# Patient Record
Sex: Male | Born: 1975 | Race: White | Hispanic: No | Marital: Single | Smoking: Never smoker
Health system: Southern US, Community
[De-identification: ages and names within clinical notes are randomized; demographics above are authoritative.]

## PROBLEM LIST (undated history)

## (undated) DIAGNOSIS — R479 Unspecified speech disturbances: Secondary | ICD-10-CM

## (undated) DIAGNOSIS — F840 Autistic disorder: Secondary | ICD-10-CM

## (undated) DIAGNOSIS — D649 Anemia, unspecified: Secondary | ICD-10-CM

## (undated) DIAGNOSIS — J45909 Unspecified asthma, uncomplicated: Secondary | ICD-10-CM

## (undated) DIAGNOSIS — F79 Unspecified intellectual disabilities: Secondary | ICD-10-CM

## (undated) DIAGNOSIS — F603 Borderline personality disorder: Secondary | ICD-10-CM

---

## 1998-02-20 ENCOUNTER — Emergency Department (HOSPITAL_COMMUNITY): Admission: EM | Admit: 1998-02-20 | Discharge: 1998-02-20 | Payer: Self-pay | Admitting: Emergency Medicine

## 1999-03-03 ENCOUNTER — Ambulatory Visit (HOSPITAL_COMMUNITY): Admission: RE | Admit: 1999-03-03 | Discharge: 1999-03-03 | Payer: Self-pay | Admitting: Family Medicine

## 1999-03-03 ENCOUNTER — Encounter: Payer: Self-pay | Admitting: Family Medicine

## 2000-01-11 ENCOUNTER — Emergency Department (HOSPITAL_COMMUNITY): Admission: EM | Admit: 2000-01-11 | Discharge: 2000-01-11 | Payer: Self-pay | Admitting: Emergency Medicine

## 2000-01-18 ENCOUNTER — Emergency Department (HOSPITAL_COMMUNITY): Admission: EM | Admit: 2000-01-18 | Discharge: 2000-01-18 | Payer: Self-pay | Admitting: Emergency Medicine

## 2000-11-29 ENCOUNTER — Emergency Department (HOSPITAL_COMMUNITY): Admission: EM | Admit: 2000-11-29 | Discharge: 2000-11-29 | Payer: Self-pay | Admitting: Emergency Medicine

## 2000-11-29 ENCOUNTER — Encounter: Payer: Self-pay | Admitting: Emergency Medicine

## 2002-06-19 ENCOUNTER — Emergency Department (HOSPITAL_COMMUNITY): Admission: EM | Admit: 2002-06-19 | Discharge: 2002-06-19 | Payer: Self-pay | Admitting: Emergency Medicine

## 2002-08-21 ENCOUNTER — Emergency Department (HOSPITAL_COMMUNITY): Admission: EM | Admit: 2002-08-21 | Discharge: 2002-08-21 | Payer: Self-pay | Admitting: Emergency Medicine

## 2002-09-18 ENCOUNTER — Emergency Department (HOSPITAL_COMMUNITY): Admission: EM | Admit: 2002-09-18 | Discharge: 2002-09-19 | Payer: Self-pay | Admitting: Emergency Medicine

## 2012-04-30 ENCOUNTER — Ambulatory Visit
Admission: RE | Admit: 2012-04-30 | Discharge: 2012-04-30 | Disposition: A | Discharge status: Home / Self Care | Payer: Medicaid Other | Source: Ambulatory Visit | Attending: Family Medicine | Admitting: Family Medicine

## 2012-04-30 ENCOUNTER — Other Ambulatory Visit: Payer: Self-pay | Admitting: Family Medicine

## 2012-08-06 ENCOUNTER — Other Ambulatory Visit: Payer: Self-pay | Admitting: Family Medicine

## 2012-08-06 ENCOUNTER — Ambulatory Visit
Admission: RE | Admit: 2012-08-06 | Discharge: 2012-08-06 | Disposition: A | Discharge status: Home / Self Care | Payer: Medicaid Other | Source: Ambulatory Visit | Attending: Family Medicine | Admitting: Family Medicine

## 2012-08-06 DIAGNOSIS — R05 Cough: Secondary | ICD-10-CM

## 2017-03-02 ENCOUNTER — Inpatient Hospital Stay (HOSPITAL_COMMUNITY): Payer: Medicare Other

## 2017-03-02 ENCOUNTER — Inpatient Hospital Stay (HOSPITAL_COMMUNITY)
Admission: EM | Admit: 2017-03-02 | Discharge: 2017-03-06 | DRG: 389 | Disposition: A | Discharge status: Home / Self Care | Payer: Medicare Other | Attending: Nephrology | Admitting: Nephrology

## 2017-03-02 ENCOUNTER — Ambulatory Visit (HOSPITAL_COMMUNITY)
Admission: EM | Admit: 2017-03-02 | Discharge: 2017-03-02 | Disposition: A | Discharge status: Nursing Home (Includes ICF) | Payer: Medicare Other | Source: Home / Self Care

## 2017-03-02 ENCOUNTER — Encounter (HOSPITAL_COMMUNITY): Payer: Self-pay | Admitting: *Deleted

## 2017-03-02 ENCOUNTER — Emergency Department (HOSPITAL_COMMUNITY): Payer: Medicare Other

## 2017-03-02 DIAGNOSIS — J4520 Mild intermittent asthma, uncomplicated: Secondary | ICD-10-CM

## 2017-03-02 DIAGNOSIS — Z880 Allergy status to penicillin: Secondary | ICD-10-CM | POA: Diagnosis not present

## 2017-03-02 DIAGNOSIS — R000 Tachycardia, unspecified: Secondary | ICD-10-CM | POA: Diagnosis not present

## 2017-03-02 DIAGNOSIS — K56609 Unspecified intestinal obstruction, unspecified as to partial versus complete obstruction: Secondary | ICD-10-CM | POA: Diagnosis not present

## 2017-03-02 DIAGNOSIS — K5900 Constipation, unspecified: Secondary | ICD-10-CM | POA: Diagnosis present

## 2017-03-02 DIAGNOSIS — Z881 Allergy status to other antibiotic agents status: Secondary | ICD-10-CM

## 2017-03-02 DIAGNOSIS — Z79899 Other long term (current) drug therapy: Secondary | ICD-10-CM | POA: Diagnosis not present

## 2017-03-02 DIAGNOSIS — F79 Unspecified intellectual disabilities: Secondary | ICD-10-CM | POA: Diagnosis not present

## 2017-03-02 DIAGNOSIS — Z0189 Encounter for other specified special examinations: Secondary | ICD-10-CM

## 2017-03-02 DIAGNOSIS — F603 Borderline personality disorder: Secondary | ICD-10-CM | POA: Diagnosis present

## 2017-03-02 DIAGNOSIS — E871 Hypo-osmolality and hyponatremia: Secondary | ICD-10-CM | POA: Diagnosis not present

## 2017-03-02 DIAGNOSIS — J45909 Unspecified asthma, uncomplicated: Secondary | ICD-10-CM | POA: Diagnosis present

## 2017-03-02 DIAGNOSIS — Z66 Do not resuscitate: Secondary | ICD-10-CM | POA: Diagnosis not present

## 2017-03-02 DIAGNOSIS — I10 Essential (primary) hypertension: Secondary | ICD-10-CM | POA: Diagnosis present

## 2017-03-02 DIAGNOSIS — E860 Dehydration: Secondary | ICD-10-CM | POA: Diagnosis not present

## 2017-03-02 HISTORY — DX: Autistic disorder: F84.0

## 2017-03-02 HISTORY — DX: Borderline personality disorder: F60.3

## 2017-03-02 HISTORY — DX: Unspecified intellectual disabilities: F79

## 2017-03-02 HISTORY — DX: Unspecified asthma, uncomplicated: J45.909

## 2017-03-02 HISTORY — DX: Anemia, unspecified: D64.9

## 2017-03-02 HISTORY — DX: Unspecified speech disturbances: R47.9

## 2017-03-02 LAB — CBC
HCT: 35.4 % — ABNORMAL LOW (ref 39.0–52.0)
HEMOGLOBIN: 12.1 g/dL — AB (ref 13.0–17.0)
MCH: 29.2 pg (ref 26.0–34.0)
MCHC: 34.2 g/dL (ref 30.0–36.0)
MCV: 85.3 fL (ref 78.0–100.0)
PLATELETS: 132 10*3/uL — AB (ref 150–400)
RBC: 4.15 MIL/uL — ABNORMAL LOW (ref 4.22–5.81)
RDW: 12.9 % (ref 11.5–15.5)
WBC: 10.8 10*3/uL — ABNORMAL HIGH (ref 4.0–10.5)

## 2017-03-02 LAB — COMPREHENSIVE METABOLIC PANEL
ALBUMIN: 3.7 g/dL (ref 3.5–5.0)
ALK PHOS: 45 U/L (ref 38–126)
ALT: 12 U/L — ABNORMAL LOW (ref 17–63)
ANION GAP: 10 (ref 5–15)
AST: 27 U/L (ref 15–41)
BILIRUBIN TOTAL: 0.7 mg/dL (ref 0.3–1.2)
BUN: 10 mg/dL (ref 6–20)
CALCIUM: 9 mg/dL (ref 8.9–10.3)
CO2: 22 mmol/L (ref 22–32)
CREATININE: 0.39 mg/dL — AB (ref 0.61–1.24)
Chloride: 90 mmol/L — ABNORMAL LOW (ref 101–111)
GFR calc non Af Amer: >60 mL/min (ref >60)
GLUCOSE: 117 mg/dL — AB (ref 65–99)
Potassium: 3.7 mmol/L (ref 3.5–5.1)
Sodium: 122 mmol/L — ABNORMAL LOW (ref 135–145)
TOTAL PROTEIN: 7.1 g/dL (ref 6.5–8.1)

## 2017-03-02 LAB — LIPASE, BLOOD: Lipase: 19 U/L (ref 11–51)

## 2017-03-02 MED ORDER — ONDANSETRON HCL 4 MG PO TABS: 4.0000 mg | ORAL_TABLET | Freq: Four times a day (QID) | ORAL | Status: DC | PRN

## 2017-03-02 MED ORDER — SODIUM CHLORIDE 0.9 % IV SOLN
INTRAVENOUS | Status: DC
  Administered 2017-03-02: 23:00:00 via INTRAVENOUS

## 2017-03-02 MED ORDER — ACETAMINOPHEN 325 MG PO TABS: 650.0000 mg | ORAL_TABLET | Freq: Four times a day (QID) | ORAL | Status: DC | PRN

## 2017-03-02 MED ORDER — MORPHINE SULFATE (PF) 4 MG/ML IV SOLN
4.0000 mg | Freq: Once | INTRAVENOUS | Status: AC
  Administered 2017-03-02: 4 mg via INTRAVENOUS
  Filled 2017-03-02: qty 1

## 2017-03-02 MED ORDER — DIATRIZOATE MEGLUMINE & SODIUM 66-10 % PO SOLN
90.0000 mL | Freq: Once | ORAL | Status: DC
  Administered 2017-03-02: 90 mL via NASOGASTRIC
  Filled 2017-03-02: qty 90

## 2017-03-02 MED ORDER — IOPAMIDOL (ISOVUE-300) INJECTION 61%
INTRAVENOUS | Status: AC
  Administered 2017-03-02: 100 mL
  Filled 2017-03-02: qty 100

## 2017-03-02 MED ORDER — ACETAMINOPHEN 650 MG RE SUPP: 650.0000 mg | Freq: Four times a day (QID) | RECTAL | Status: DC | PRN

## 2017-03-02 MED ORDER — SODIUM CHLORIDE 0.9 % IV BOLUS (SEPSIS)
1000.0000 mL | Freq: Once | INTRAVENOUS | Status: AC
  Administered 2017-03-02: 1000 mL via INTRAVENOUS

## 2017-03-02 MED ORDER — OLANZAPINE 5 MG PO TBDP
15.0000 mg | ORAL_TABLET | Freq: Three times a day (TID) | ORAL | Status: DC
  Administered 2017-03-03 – 2017-03-05 (×8): 15 mg via ORAL
  Filled 2017-03-02 (×9): qty 3

## 2017-03-02 MED ORDER — LORAZEPAM 2 MG/ML IJ SOLN
0.5000 mg | INTRAMUSCULAR | Status: DC | PRN
  Administered 2017-03-03: 0.5 mg via INTRAVENOUS
  Filled 2017-03-02: qty 1

## 2017-03-02 MED ORDER — ONDANSETRON HCL 4 MG/2ML IJ SOLN
4.0000 mg | Freq: Four times a day (QID) | INTRAMUSCULAR | Status: DC | PRN
  Administered 2017-03-02: 4 mg via INTRAVENOUS
  Filled 2017-03-02: qty 2

## 2017-03-02 MED ORDER — BISACODYL 10 MG RE SUPP: 10.0000 mg | Freq: Every day | RECTAL | Status: DC | PRN

## 2017-03-02 MED ORDER — ONDANSETRON HCL 4 MG/2ML IJ SOLN
4.0000 mg | Freq: Once | INTRAMUSCULAR | Status: AC
  Administered 2017-03-02: 4 mg via INTRAVENOUS
  Filled 2017-03-02: qty 2

## 2017-03-02 MED ORDER — METOPROLOL TARTRATE 5 MG/5ML IV SOLN
2.5000 mg | Freq: Two times a day (BID) | INTRAVENOUS | Status: DC
  Administered 2017-03-03: 2.5 mg via INTRAVENOUS
  Filled 2017-03-02: qty 5

## 2017-03-02 MED ORDER — MILK AND MOLASSES ENEMA
1.0000 | Freq: Once | RECTAL | Status: AC
  Administered 2017-03-03: 250 mL via RECTAL
  Filled 2017-03-02: qty 250

## 2017-03-02 NOTE — ED Notes (Signed)
Patient transported to X-ray 

## 2017-03-02 NOTE — ED Notes (Signed)
Pt's mom now at bedside.  Dr. Adela Glimpseoutova at bedside assessing pt.  NG tube pulled slightly out and reinserted per Dr. Adela Glimpseoutova due to coiled in proximal stomach.  Verified with auscultation that is still partially faint.  X-ray to be repeated.

## 2017-03-02 NOTE — ED Triage Notes (Signed)
Pt in with care provider, pt lives in group home per provider, pt sent for eval for abd pain & emesis onset today, care provider is a poor historian and this RN is unable to locate medical histor in the record book provided by [facility] from [facility] [place], pt non verbal in triage, pt at neuro baseline per care provider, reports 3 semi formed stools today

## 2017-03-02 NOTE — ED Notes (Signed)
Spoke to Dr. Aggie Hackerotova regarding NG tube placement.  Call placed to Dr. Janee Mornhompson regarding same.

## 2017-03-02 NOTE — H&P (Signed)
Ralph Black ZOX:[id number] DOB: Feb 24, 1976 DOA: 03/02/2017     PCP:  [facility]: none  Patient coming from: From facility [facility]   Chief Complaint: Nausea vomiting abdominal pain and distention  HPI: Ralph Black is a 41 y.o. male with medical history significant of severe autism, explosive behavior disorder, asthma    Presented with abdominal pain, distention and vomiting starting today. Patient unable to provide his own history but caregiver states that he have had 3  formed stools today.  He was seen by PCP for referred him to emerge department given abdominal distention and pain   Regarding pertinent Chronic problems: Significant behavioral problems managed with Depakote and Klonopin He is on metoprolol for unclear reason  IN ER:  Temp (24hrs), Avg:98.5 F (36.9 C), Min:98.5 F (36.9 C), Max:98.5 F (36.9 C)      on arrival  ED Triage Vitals  Enc Vitals Group     BP 03/02/17 1554 (!) 135/92     Pulse Rate 03/02/17 1554 (!) 105     Resp 03/02/17 1554 14     Temp 03/02/17 1554 98.5 F (36.9 C)     Temp Source 03/02/17 1554 Oral     SpO2 03/02/17 1554 96 %     Weight 03/02/17 1555 136 lb (61.7 kg)     Height 03/02/17 1555 5\' 4"  (1.626 m)     Head Circumference --      Peak Flow --      Pain Score --      Pain Loc --      Pain Edu? --      Excl. in GC? --    HR 107 cr 0.39  CT abd: Distal small bowel obstruction with multiple dilated loops of small bowel and fluid levels, increased stool burden.  Following Medications were ordered in ER: Medications  diatrizoate meglumine-sodium (GASTROGRAFIN) 66-10 % solution 90 mL (not administered)  morphine 4 MG/ML injection 4 mg (4 mg Intravenous Given 03/02/17 1805)  ondansetron (ZOFRAN) injection 4 mg (4 mg Intravenous Given 03/02/17 1805)  sodium chloride 0.9 % bolus 1,000 mL (1,000 mLs Intravenous New Bag/Given 03/02/17 1805)  iopamidol (ISOVUE-300) 61 % injection (100 mLs   Contrast Given 03/02/17 1845)  morphine 4 MG/ML injection 4 mg (4 mg Intravenous Given 03/02/17 2003)     ER provider discussed case with:   Dr Violeta Gelinas general surgery, who will see patient in ED  Hospitalist was called for admission for SBO  Review of Systems:   Unable to obtain secondary to patient  be nonverbal, caregiver denies any fevers no diarrhea have had some constipation Abdominal pain nausea and vomiting  Past Medical History: Past Medical History:  Diagnosis Date  . Anemia   . Asthma   . Autism   . Explosive personality disorder   . Intellectual disability   . Speech disorder    History reviewed. No pertinent surgical history.   Social History:  Ambulatory   independently      reports that he has never smoked. He has never used smokeless tobacco. He reports that he does not drink alcohol or use drugs.  Allergies:   Allergies  Allergen Reactions  . Cephalosporins     Unknown   . Penicillins     unknown      Family History:   Family History  Problem Relation Age of Onset  . CAD Neg Hx   . Diabetes Neg Hx   .  Hypertension Neg Hx   . Cancer Neg Hx     Medications: Prior to Admission medications   Not on File    Physical Exam: Patient Vitals for the past 24 hrs:  BP Temp Temp src Pulse Resp SpO2 Height Weight  03/02/17 1930 (!) 132/97 - - (!) 111 - 93 % - -  03/02/17 1815 (!) 132/106 - - (!) 107 18 95 % - -  03/02/17 1800 (!) 142/96 - - (!) 108 - 100 % - -  03/02/17 1745 (!) 111/95 - - (!) 107 - 97 % - -  03/02/17 1555 - - - - - - 5\' 4"  (1.626 m) 61.7 kg (136 lb)  03/02/17 1554 (!) 135/92 98.5 F (36.9 C) Oral (!) 105 14 96 % - -    1. General:  in No Acute distress 2. Psychological: Alert and  Oriented 3. Head/ENT:     Dry Mucous Membranes                          Head Non traumatic, neck supple                           Poor Dentition, NG in Place 4. SKIN:  decreased Skin turgor,  Skin clean Dry and intact no rash 5. Heart:  Regular rate and rhythm no  Murmur, Rub or gallop 6. Lungs:  no wheezes or crackles   7. Abdomen: Soft,  tender,  distended 8. Lower extremities: no clubbing, cyanosis, or edema 9. Neurologically Grossly intact, moving all 4 extremities equally   10. MSK: Normal range of motion   body mass index is 23.34 kg/m.  Labs on Admission:   Labs on Admission: I have personally reviewed following labs and imaging studies  CBC:  Recent Labs Lab 03/02/17 1601  WBC 10.8*  HGB 12.1*  HCT 35.4*  MCV 85.3  PLT 132*   Basic Metabolic Panel:  Recent Labs Lab 03/02/17 1601  NA 122*  K 3.7  CL 90*  CO2 22  GLUCOSE 117*  BUN 10  CREATININE 0.39*  CALCIUM 9.0   GFR: Estimated Creatinine Clearance: 101.8 mL/min (A) (by C-G formula based on SCr of 0.39 mg/dL (L)). Liver Function Tests:  Recent Labs Lab 03/02/17 1601  AST 27  ALT 12*  ALKPHOS 45  BILITOT 0.7  PROT 7.1  ALBUMIN 3.7    Recent Labs Lab 03/02/17 1601  LIPASE 19   No results for input(s): AMMONIA in the last 168 hours. Coagulation Profile: No results for input(s): INR, PROTIME in the last 168 hours. Cardiac Enzymes: No results for input(s): CKTOTAL, CKMB, CKMBINDEX, TROPONINI in the last 168 hours. BNP (last 3 results) No results for input(s): PROBNP in the last 8760 hours. HbA1C: No results for input(s): HGBA1C in the last 72 hours. CBG: No results for input(s): GLUCAP in the last 168 hours. Lipid Profile: No results for input(s): CHOL, HDL, LDLCALC, TRIG, CHOLHDL, LDLDIRECT in the last 72 hours. Thyroid Function Tests: No results for input(s): TSH, T4TOTAL, FREET4, T3FREE, THYROIDAB in the last 72 hours. Anemia Panel: No results for input(s): VITAMINB12, FOLATE, FERRITIN, TIBC, IRON, RETICCTPCT in the last 72 hours. Urine analysis: No results found for: COLORURINE, APPEARANCEUR, LABSPEC, PHURINE, GLUCOSEU, HGBUR, BILIRUBINUR, KETONESUR, PROTEINUR, UROBILINOGEN, NITRITE, LEUKOCYTESUR Sepsis  Labs: @LABRCNTIP (procalcitonin:4,lacticidven:4) )No results found for this or any previous visit (from the past 240 hour(s)).     UA ordered  No results found for: HGBA1C  Estimated Creatinine Clearance: 101.8 mL/min (A) (by C-G formula based on SCr of 0.39 mg/dL (L)).  BNP (last 3 results) No results for input(s): PROBNP in the last 8760 hours.   ECG REPORT Not obtained  Filed Weights   03/02/17 1555  Weight: 61.7 kg (136 lb)     Cultures: No results found for: SDES, SPECREQUEST, CULT, REPTSTATUS   Radiological Exams on Admission: Ct Abdomen Pelvis W Contrast  Result Date: 03/02/2017 CLINICAL DATA:  Abdominal pain and distention. EXAM: CT ABDOMEN AND PELVIS WITH CONTRAST TECHNIQUE: Multidetector CT imaging of the abdomen and pelvis was performed using the standard protocol following bolus administration of intravenous contrast. CONTRAST:  <See Chart> ISOVUE-300 IOPAMIDOL (ISOVUE-300) INJECTION 61% COMPARISON:  Acute abdominal series from the same day. FINDINGS: Lower chest: Bilateral lower lobe atelectasis present. The heart size is normal. No significant pleural or pericardial effusion is present. Hepatobiliary: No focal hepatic lesions are present. The common bile duct and gallbladder are within normal limits. Pancreas: Unremarkable. No pancreatic ductal dilatation or surrounding inflammatory changes. Spleen: Normal in size without focal abnormality. Adrenals/Urinary Tract: The adrenal glands are within normal limits bilaterally. The kidneys and ureters are unremarkable. The urinary bladder is mostly collapsed. Stomach/Bowel: The stomach is markedly distended. A fluid level is present. The second and third portions of the duodenum are not distended. Very proximal small bowel is not distended. The remainder the small bowel is distended with multiple fluid levels. Loops of small bowel measure up to 3.6 cm. Very distal terminal ileum is collapsed. There is a rotated segment at the base of  the sigmoid colon. This could represent an internal hernia. There is fluid in the proximal colon. Stool is present of the sigmoid colon. The sigmoid colon extends superiorly and to the right without a definite volvulus. The more distal sigmoid colon is less distended. The rectum is unremarkable. Vascular/Lymphatic: No significant vascular findings are present. No enlarged abdominal or pelvic lymph nodes. Reproductive: Prostate is unremarkable. Other: No abdominal wall hernia or abnormality. No abdominopelvic ascites. Musculoskeletal: No focal lytic or blastic lesions are present. Vertebral body heights and alignment are maintained. IMPRESSION: 1. Distal small bowel obstruction with multiple dilated loops of small bowel and fluid levels. 2. The very distal terminal ileum is collapsed. The obstruction appears to be at the base of the mesentery. Internal hernia is not excluded. 3. Moderate stool is present within the descending and sigmoid colon. A relatively long segment of sigmoid colon is noted. There is no definite volvulus. Electronically Signed   By: Marin Robertshristopher  Mattern M.D.   On: 03/02/2017 19:12   Dg Abdomen Acute W/chest  Result Date: 03/02/2017 CLINICAL DATA:  Abdominal distension with pain EXAM: DG ABDOMEN ACUTE W/ 1V CHEST COMPARISON:  08/06/2012 FINDINGS: Sitting view of the chest demonstrates low lung volumes with bibasilar atelectasis and elevation of the right diaphragm. No large effusion. The heart size is within normal limits. No free air beneath the diaphragm. The stomach is markedly enlarged. Dilated small bowel within the central and right upper quadrant up to 4 cm with multiple fluid levels. Bowel gas is present within the colon. Pelvis is incompletely visualized. IMPRESSION: 1. Low lung volumes with bibasilar atelectasis 2. Dilated stomach. Multiple loops of dilated small bowel with gas present in the colon and multiple fluid levels, findings could be secondary to ileus although partial or  developing small bowel obstruction is also a concern. Electronically Signed   By: Adrian ProwsKim  Fujinaga M.D.  On: 03/02/2017 18:42    Chart has been reviewed    Assessment/Plan  41 y.o. male with medical history significant of severe autism, explosive behavior disorder, asthma Admitted for SBO dehydration and hyponatremia  Present on Admission: . SBO (small bowel obstruction) (HCC) - appreciate general surgery consult, NG tube to intermittent suction, SBO protocol as per general surgery follow-up repeat KUB in a.m. Marland Kitchen Hyponatremia - obtain urine electrolytes rehydrate and follow sodium void overaggressive correction hold possibly contributing medications such as Desmopressin that patient takes for urinary incontinence . Asthma able on by mouth when necessary . Explosive personality disorder -sitter at bedside, hold by mouth medications can administer when necessary Ativan IV while nothing by mouth, can change Zyprexa to dissolvable tablet . Dehydration hydrate with IV fluids History of hypertension continue metoprolol but changed to IV Hypertension - continue metoprolol but changed to IV holding parameters Other plan as per orders.  DVT prophylaxis:  SCD     Code Status:  DNR/DNI as per mother   Family Communication:   Family  at  Bedside  plan of care was discussed with  mother  Disposition Plan:                              Back to current facility when stable                                             Consults called: general surgery   Admission status:    inpatient     Level of care   medical floor          I have spent a total of 56 min on this admission    Wen Merced 03/02/2017, 9:24 PM    [facility]  Pager [phone]   after 2 AM please page floor coverage PA If 7AM-7PM, please contact the day team taking care of the patient  [email or web address]  Password [email or web address]

## 2017-03-02 NOTE — Consult Note (Signed)
Reason for Consult:SBO Referring Physician: J Long  Ralph Black is an 41 y.o. male.  HPI: Ralph Black has severe autism and lives in a group home. He developed vomiting around 1:30 AM. He had 2 more episodes of vomiting today. His abdomen was noted to be distended. He saw his primary care doctor, Dr. Jeanie Cooks, who referred him to the emergency department. Workup included CT scan of the abdomen and pelvis which shows small bowel obstruction with a distal ileum transition point. I was asked to see him in consultation. He is unable to provide history. An attendant from the group home is with him and provided his list of medications which include metoprolol as well as multiple psychiatric medications for behavioral control.  Past Medical History:  Diagnosis Date  . Anemia   . Asthma   . Autism   . Explosive personality disorder   . Intellectual disability   . Speech disorder     History reviewed. No pertinent surgical history.  No family history on file.  Social History:  reports that he has never smoked. He has never used smokeless tobacco. He reports that he does not drink alcohol or use drugs.  Allergies:  Allergies  Allergen Reactions  . Cephalosporins     Unknown   . Penicillins     unknown    Medications: I have reviewed the patient's current medications.  Results for orders placed or performed during the hospital encounter of 03/02/17 (from the past 48 hour(s))  Lipase, blood     Status: None   Collection Time: 03/02/17  4:01 PM  Result Value Ref Range   Lipase 19 11 - 51 U/L  Comprehensive metabolic panel     Status: Abnormal   Collection Time: 03/02/17  4:01 PM  Result Value Ref Range   Sodium 122 (L) 135 - 145 mmol/L   Potassium 3.7 3.5 - 5.1 mmol/L   Chloride 90 (L) 101 - 111 mmol/L   CO2 22 22 - 32 mmol/L   Glucose, Bld 117 (H) 65 - 99 mg/dL   BUN 10 6 - 20 mg/dL   Creatinine, Ser 0.39 (L) 0.61 - 1.24 mg/dL   Calcium 9.0 8.9 - 10.3 mg/dL   Total Protein 7.1 6.5 -  8.1 g/dL   Albumin 3.7 3.5 - 5.0 g/dL   AST 27 15 - 41 U/L   ALT 12 (L) 17 - 63 U/L   Alkaline Phosphatase 45 38 - 126 U/L   Total Bilirubin 0.7 0.3 - 1.2 mg/dL   GFR calc non Af Amer >60 >60 mL/min   GFR calc Af Amer >60 >60 mL/min    Comment: (NOTE) The eGFR has been calculated using the CKD EPI equation. This calculation has not been validated in all clinical situations. eGFR's persistently <60 mL/min signify possible Chronic Kidney Disease.    Anion gap 10 5 - 15  CBC     Status: Abnormal   Collection Time: 03/02/17  4:01 PM  Result Value Ref Range   WBC 10.8 (H) 4.0 - 10.5 K/uL   RBC 4.15 (L) 4.22 - 5.81 MIL/uL   Hemoglobin 12.1 (L) 13.0 - 17.0 g/dL   HCT 35.4 (L) 39.0 - 52.0 %   MCV 85.3 78.0 - 100.0 fL   MCH 29.2 26.0 - 34.0 pg   MCHC 34.2 30.0 - 36.0 g/dL   RDW 12.9 11.5 - 15.5 %   Platelets 132 (L) 150 - 400 K/uL    Ct Abdomen Pelvis W Contrast  Result Date: 03/02/2017 CLINICAL DATA:  Abdominal pain and distention. EXAM: CT ABDOMEN AND PELVIS WITH CONTRAST TECHNIQUE: Multidetector CT imaging of the abdomen and pelvis was performed using the standard protocol following bolus administration of intravenous contrast. CONTRAST:  <See Chart> ISOVUE-300 IOPAMIDOL (ISOVUE-300) INJECTION 61% COMPARISON:  Acute abdominal series from the same day. FINDINGS: Lower chest: Bilateral lower lobe atelectasis present. The heart size is normal. No significant pleural or pericardial effusion is present. Hepatobiliary: No focal hepatic lesions are present. The common bile duct and gallbladder are within normal limits. Pancreas: Unremarkable. No pancreatic ductal dilatation or surrounding inflammatory changes. Spleen: Normal in size without focal abnormality. Adrenals/Urinary Tract: The adrenal glands are within normal limits bilaterally. The kidneys and ureters are unremarkable. The urinary bladder is mostly collapsed. Stomach/Bowel: The stomach is markedly distended. A fluid level is present. The  second and third portions of the duodenum are not distended. Very proximal small bowel is not distended. The remainder the small bowel is distended with multiple fluid levels. Loops of small bowel measure up to 3.6 cm. Very distal terminal ileum is collapsed. There is a rotated segment at the base of the sigmoid colon. This could represent an internal hernia. There is fluid in the proximal colon. Stool is present of the sigmoid colon. The sigmoid colon extends superiorly and to the right without a definite volvulus. The more distal sigmoid colon is less distended. The rectum is unremarkable. Vascular/Lymphatic: No significant vascular findings are present. No enlarged abdominal or pelvic lymph nodes. Reproductive: Prostate is unremarkable. Other: No abdominal wall hernia or abnormality. No abdominopelvic ascites. Musculoskeletal: No focal lytic or blastic lesions are present. Vertebral body heights and alignment are maintained. IMPRESSION: 1. Distal small bowel obstruction with multiple dilated loops of small bowel and fluid levels. 2. The very distal terminal ileum is collapsed. The obstruction appears to be at the base of the mesentery. Internal hernia is not excluded. 3. Moderate stool is present within the descending and sigmoid colon. A relatively long segment of sigmoid colon is noted. There is no definite volvulus. Electronically Signed   By: San Morelle M.D.   On: 03/02/2017 19:12   Dg Abdomen Acute W/chest  Result Date: 03/02/2017 CLINICAL DATA:  Abdominal distension with pain EXAM: DG ABDOMEN ACUTE W/ 1V CHEST COMPARISON:  08/06/2012 FINDINGS: Sitting view of the chest demonstrates low lung volumes with bibasilar atelectasis and elevation of the right diaphragm. No large effusion. The heart size is within normal limits. No free air beneath the diaphragm. The stomach is markedly enlarged. Dilated small bowel within the central and right upper quadrant up to 4 cm with multiple fluid levels. Bowel  gas is present within the colon. Pelvis is incompletely visualized. IMPRESSION: 1. Low lung volumes with bibasilar atelectasis 2. Dilated stomach. Multiple loops of dilated small bowel with gas present in the colon and multiple fluid levels, findings could be secondary to ileus although partial or developing small bowel obstruction is also a concern. Electronically Signed   By: Donavan Foil M.D.   On: 03/02/2017 18:42    Review of Systems  Unable to perform ROS: Psychiatric disorder   Blood pressure (!) 132/97, pulse (!) 111, temperature 98.5 F (36.9 C), temperature source Oral, resp. rate 18, height '5\' 4"'  (1.626 m), weight 61.7 kg (136 lb), SpO2 93 %. Physical Exam  Constitutional: He appears well-developed and well-nourished. No distress.  HENT:  Head: Normocephalic.  Nose: Nose normal.  Mouth/Throat: Oropharynx is clear and moist.  Eyes: EOM  are normal. Pupils are equal, round, and reactive to light.  Neck: Neck supple. No tracheal deviation present.  Cardiovascular: Intact distal pulses.   Mild tachycardia 90s  Respiratory: Effort normal and breath sounds normal. No respiratory distress. He has no wheezes. He has no rales.  GI: Soft. He exhibits distension. There is tenderness. There is no rebound and no guarding.  Quite distended, minimal tenderness, few bowel sounds, no peritonitis  Musculoskeletal: Normal range of motion.  Neurological: He is alert.  Seems to answer yes or no, speech consists of grunts  Skin: Skin is warm.    Assessment/Plan: SBO - NGT, SBO protocol, we will follow closely. Does not need emergent surgery but may require operative intervention if it does not improve. Significant colonic stool burden so will likely benefit from enemas as well. Agree with medical admission for management of his psychiatric issues.  Madyx Delfin E 03/02/2017, 7:46 PM

## 2017-03-02 NOTE — ED Notes (Addendum)
Spoke with Dr. Janee Mornhompson regarding NG tube.  He reviewed x-ray and stated to leave in place.  Harriett SineNancy, RN on 6N notified of same.

## 2017-03-02 NOTE — ED Notes (Signed)
After NG placed and nurse walked out of room, pt pulled NG tube partially out and sitter from group home stopped him.  NG tubed replaced without difficulty.  Sitter is now sitting closer and reminding him not to pull it.

## 2017-03-02 NOTE — ED Notes (Signed)
ED Provider at bedside. 

## 2017-03-02 NOTE — ED Provider Notes (Signed)
[facility]-EMERGENCY DEPT Provider Note   CSN: [id number] Arrival date & time: 03/02/17  1541     History   Chief Complaint Chief Complaint  Patient presents with  . Abdominal Pain    HPI Ralph Black is a 41 y.o. male.  HPI   Pt with hx autism, speech disorder, intellectual disability p/w abdominal pain, distension, N/V, thick stools.  History given by group home staff.  Pt is nonverbal.  Level V caveat  Past Medical History:  Diagnosis Date  . Anemia   . Asthma   . Autism   . Explosive personality disorder   . Intellectual disability   . Speech disorder     There are no active problems to display for this patient.   History reviewed. No pertinent surgical history.     Home Medications    Prior to Admission medications   Not on File    Family History No family history on file.  Social History Social History  Substance Use Topics  . Smoking status: Never Smoker  . Smokeless tobacco: Never Used  . Alcohol use No     Allergies   Cephalosporins and Penicillins   Review of Systems Review of Systems  Unable to perform ROS: Patient nonverbal     Physical Exam Updated Vital Signs BP (!) 132/97   Pulse (!) 111   Temp 98.5 F (36.9 C) (Oral)   Resp 18   Ht 5\' 4"  (1.626 m)   Wt 61.7 kg (136 lb)   SpO2 93%   BMI 23.34 kg/m   Physical Exam  Constitutional: He appears well-developed and well-nourished. No distress.  Appears uncomfortable   HENT:  Head: Normocephalic and atraumatic.  Neck: Neck supple.  Cardiovascular: Normal rate and regular rhythm.   Pulmonary/Chest: Effort normal and breath sounds normal. No respiratory distress. He has no wheezes. He has no rales.  Abdominal: Soft. He exhibits distension. There is tenderness. There is no rebound.  Neurological: He is alert. He exhibits normal muscle tone.  Grunts in response to questions  Skin: He is not diaphoretic.  Nursing note and vitals reviewed.    ED Treatments / Results    Labs (all labs ordered are listed, but only abnormal results are displayed) Labs Reviewed  COMPREHENSIVE METABOLIC PANEL - Abnormal; Notable for the following:       Result Value   Sodium 122 (*)    Chloride 90 (*)    Glucose, Bld 117 (*)    Creatinine, Ser 0.39 (*)    ALT 12 (*)    All other components within normal limits  CBC - Abnormal; Notable for the following:    WBC 10.8 (*)    RBC 4.15 (*)    Hemoglobin 12.1 (*)    HCT 35.4 (*)    Platelets 132 (*)    All other components within normal limits  LIPASE, BLOOD  URINALYSIS, ROUTINE W REFLEX MICROSCOPIC    EKG  EKG Interpretation None       Radiology Ct Abdomen Pelvis W Contrast  Result Date: 03/02/2017 CLINICAL DATA:  Abdominal pain and distention. EXAM: CT ABDOMEN AND PELVIS WITH CONTRAST TECHNIQUE: Multidetector CT imaging of the abdomen and pelvis was performed using the standard protocol following bolus administration of intravenous contrast. CONTRAST:  <See Chart> ISOVUE-300 IOPAMIDOL (ISOVUE-300) INJECTION 61% COMPARISON:  Acute abdominal series from the same day. FINDINGS: Lower chest: Bilateral lower lobe atelectasis present. The heart size is normal. No significant pleural or pericardial effusion is present. Hepatobiliary:  No focal hepatic lesions are present. The common bile duct and gallbladder are within normal limits. Pancreas: Unremarkable. No pancreatic ductal dilatation or surrounding inflammatory changes. Spleen: Normal in size without focal abnormality. Adrenals/Urinary Tract: The adrenal glands are within normal limits bilaterally. The kidneys and ureters are unremarkable. The urinary bladder is mostly collapsed. Stomach/Bowel: The stomach is markedly distended. A fluid level is present. The second and third portions of the duodenum are not distended. Very proximal small bowel is not distended. The remainder the small bowel is distended with multiple fluid levels. Loops of small bowel measure up to 3.6 cm.  Very distal terminal ileum is collapsed. There is a rotated segment at the base of the sigmoid colon. This could represent an internal hernia. There is fluid in the proximal colon. Stool is present of the sigmoid colon. The sigmoid colon extends superiorly and to the right without a definite volvulus. The more distal sigmoid colon is less distended. The rectum is unremarkable. Vascular/Lymphatic: No significant vascular findings are present. No enlarged abdominal or pelvic lymph nodes. Reproductive: Prostate is unremarkable. Other: No abdominal wall hernia or abnormality. No abdominopelvic ascites. Musculoskeletal: No focal lytic or blastic lesions are present. Vertebral body heights and alignment are maintained. IMPRESSION: 1. Distal small bowel obstruction with multiple dilated loops of small bowel and fluid levels. 2. The very distal terminal ileum is collapsed. The obstruction appears to be at the base of the mesentery. Internal hernia is not excluded. 3. Moderate stool is present within the descending and sigmoid colon. A relatively long segment of sigmoid colon is noted. There is no definite volvulus. Electronically Signed   By: Marin Roberts M.D.   On: 03/02/2017 19:12   Dg Abdomen Acute W/chest  Result Date: 03/02/2017 CLINICAL DATA:  Abdominal distension with pain EXAM: DG ABDOMEN ACUTE W/ 1V CHEST COMPARISON:  08/06/2012 FINDINGS: Sitting view of the chest demonstrates low lung volumes with bibasilar atelectasis and elevation of the right diaphragm. No large effusion. The heart size is within normal limits. No free air beneath the diaphragm. The stomach is markedly enlarged. Dilated small bowel within the central and right upper quadrant up to 4 cm with multiple fluid levels. Bowel gas is present within the colon. Pelvis is incompletely visualized. IMPRESSION: 1. Low lung volumes with bibasilar atelectasis 2. Dilated stomach. Multiple loops of dilated small bowel with gas present in the colon and  multiple fluid levels, findings could be secondary to ileus although partial or developing small bowel obstruction is also a concern. Electronically Signed   By: Jasmine Pang M.D.   On: 03/02/2017 18:42    Procedures Procedures (including critical care time)  Medications Ordered in ED Medications  diatrizoate meglumine-sodium (GASTROGRAFIN) 66-10 % solution 90 mL (not administered)  morphine 4 MG/ML injection 4 mg (4 mg Intravenous Given 03/02/17 1805)  ondansetron (ZOFRAN) injection 4 mg (4 mg Intravenous Given 03/02/17 1805)  sodium chloride 0.9 % bolus 1,000 mL (1,000 mLs Intravenous New Bag/Given 03/02/17 1805)  iopamidol (ISOVUE-300) 61 % injection (100 mLs  Contrast Given 03/02/17 1845)  morphine 4 MG/ML injection 4 mg (4 mg Intravenous Given 03/02/17 2003)     Initial Impression / Assessment and Plan / ED Course  I have reviewed the triage vital signs and the nursing notes.  Pertinent labs & imaging results that were available during my care of the patient were reviewed by me and considered in my medical decision making (see chart for details).  Clinical Course as of Mar 03 2019  Fri Mar 02, 2017  1929 I spoke with Dr Violeta Gelinas, general surgery, who will see patient in ED.    [EW]  1943 Pt has been seen by Dr Janee Morn in consult.  He will place orders.  Requests medicine admission as pt is on multiple psychiatric medications and will not be able to take any of his medications by mouth.    [EW]    Clinical Course User Index [EW] Trixie Dredge, [place]   Afebrile patient with no hx abdominal surgeries p/w abdominal pain and distension, vomiting.  CT demonstrates SBO with question of internal hernia.  Pt seen in ED by Dr Janee Morn, who is consulting, request medical admission.  Admitted to [facility], Dr Adela Glimpse accepting.    Final Clinical Impressions(s) / ED Diagnoses   Final diagnoses:  SBO (small bowel obstruction) [facility])    New Prescriptions New Prescriptions   No  medications on file     Trixie Dredge, Cordelia Poche 03/02/17 2020    Maia Plan, MD 03/03/17 [phone]

## 2017-03-02 NOTE — ED Notes (Signed)
Dr. Thompson at bedside. 

## 2017-03-02 NOTE — ED Notes (Signed)
Pt  Has   abd  Pain   Distention  And  Vomiting        Screened    By  Ander SladeBill  [place]  fnp    Send  To  [facility]   And  Remain npo

## 2017-03-03 ENCOUNTER — Inpatient Hospital Stay (HOSPITAL_COMMUNITY): Payer: Medicare Other

## 2017-03-03 DIAGNOSIS — F603 Borderline personality disorder: Secondary | ICD-10-CM

## 2017-03-03 DIAGNOSIS — K56609 Unspecified intestinal obstruction, unspecified as to partial versus complete obstruction: Principal | ICD-10-CM

## 2017-03-03 DIAGNOSIS — E860 Dehydration: Secondary | ICD-10-CM

## 2017-03-03 DIAGNOSIS — E871 Hypo-osmolality and hyponatremia: Secondary | ICD-10-CM

## 2017-03-03 LAB — URINALYSIS, ROUTINE W REFLEX MICROSCOPIC
BILIRUBIN URINE: NEGATIVE
GLUCOSE, UA: NEGATIVE mg/dL
Hgb urine dipstick: NEGATIVE
KETONES UR: 5 mg/dL — AB
Leukocytes, UA: NEGATIVE
NITRITE: NEGATIVE
PH: 5 (ref 5.0–8.0)
Protein, ur: NEGATIVE mg/dL
Specific Gravity, Urine: 1.036 — ABNORMAL HIGH (ref 1.005–1.030)

## 2017-03-03 LAB — CBC
HEMATOCRIT: 32.9 % — AB (ref 39.0–52.0)
HEMOGLOBIN: 11.1 g/dL — AB (ref 13.0–17.0)
MCH: 29.3 pg (ref 26.0–34.0)
MCHC: 33.7 g/dL (ref 30.0–36.0)
MCV: 86.8 fL (ref 78.0–100.0)
Platelets: 176 10*3/uL (ref 150–400)
RBC: 3.79 MIL/uL — AB (ref 4.22–5.81)
RDW: 13.3 % (ref 11.5–15.5)
WBC: 9.6 10*3/uL (ref 4.0–10.5)

## 2017-03-03 LAB — COMPREHENSIVE METABOLIC PANEL
ALBUMIN: 3.2 g/dL — AB (ref 3.5–5.0)
ALT: 11 U/L — ABNORMAL LOW (ref 17–63)
ANION GAP: 9 (ref 5–15)
AST: 25 U/L (ref 15–41)
Alkaline Phosphatase: 43 U/L (ref 38–126)
BUN: 8 mg/dL (ref 6–20)
CALCIUM: 8.1 mg/dL — AB (ref 8.9–10.3)
CO2: 24 mmol/L (ref 22–32)
Chloride: 93 mmol/L — ABNORMAL LOW (ref 101–111)
Creatinine, Ser: 0.42 mg/dL — ABNORMAL LOW (ref 0.61–1.24)
GFR calc non Af Amer: >60 mL/min (ref >60)
GLUCOSE: 124 mg/dL — AB (ref 65–99)
POTASSIUM: 3.8 mmol/L (ref 3.5–5.1)
Sodium: 126 mmol/L — ABNORMAL LOW (ref 135–145)
Total Bilirubin: 0.8 mg/dL (ref 0.3–1.2)
Total Protein: 5.7 g/dL — ABNORMAL LOW (ref 6.5–8.1)

## 2017-03-03 LAB — SODIUM, URINE, RANDOM: SODIUM UR: 12 mmol/L

## 2017-03-03 LAB — GLUCOSE, CAPILLARY
GLUCOSE-CAPILLARY: 135 mg/dL — AB (ref 65–99)
GLUCOSE-CAPILLARY: 146 mg/dL — AB (ref 65–99)
Glucose-Capillary: 124 mg/dL — ABNORMAL HIGH (ref 65–99)
Glucose-Capillary: 147 mg/dL — ABNORMAL HIGH (ref 65–99)

## 2017-03-03 LAB — CREATININE, URINE, RANDOM: Creatinine, Urine: 57.81 mg/dL

## 2017-03-03 LAB — MAGNESIUM: MAGNESIUM: 1.4 mg/dL — AB (ref 1.7–2.4)

## 2017-03-03 LAB — TSH: TSH: 1.983 u[IU]/mL (ref 0.350–4.500)

## 2017-03-03 LAB — PHOSPHORUS: PHOSPHORUS: 2.6 mg/dL (ref 2.5–4.6)

## 2017-03-03 LAB — HIV ANTIBODY (ROUTINE TESTING W REFLEX): HIV SCREEN 4TH GENERATION: NONREACTIVE

## 2017-03-03 LAB — OSMOLALITY, URINE: OSMOLALITY UR: 478 mosm/kg (ref 300–900)

## 2017-03-03 MED ORDER — VALPROATE SODIUM 500 MG/5ML IV SOLN
250.0000 mg | Freq: Two times a day (BID) | INTRAVENOUS | Status: DC
  Administered 2017-03-03 – 2017-03-05 (×6): 250 mg via INTRAVENOUS
  Filled 2017-03-03 (×8): qty 2.5

## 2017-03-03 MED ORDER — METOPROLOL TARTRATE 5 MG/5ML IV SOLN
5.0000 mg | Freq: Three times a day (TID) | INTRAVENOUS | Status: DC
  Administered 2017-03-03: 5 mg via INTRAVENOUS
  Administered 2017-03-03: 2.5 mg via INTRAVENOUS
  Administered 2017-03-04: 5 mg via INTRAVENOUS
  Filled 2017-03-03 (×3): qty 5

## 2017-03-03 MED ORDER — MAGNESIUM SULFATE 4 GM/100ML IV SOLN
4.0000 g | Freq: Once | INTRAVENOUS | Status: AC
  Administered 2017-03-03: 4 g via INTRAVENOUS
  Filled 2017-03-03: qty 100

## 2017-03-03 MED ORDER — POTASSIUM CHLORIDE 10 MEQ/100ML IV SOLN
10.0000 meq | INTRAVENOUS | Status: AC
  Administered 2017-03-03 (×4): 10 meq via INTRAVENOUS
  Filled 2017-03-03 (×4): qty 100

## 2017-03-03 MED ORDER — METOPROLOL TARTRATE 5 MG/5ML IV SOLN: 5.0000 mg | Freq: Two times a day (BID) | INTRAVENOUS | Status: DC

## 2017-03-03 MED ORDER — LORAZEPAM 2 MG/ML IJ SOLN
1.0000 mg | INTRAMUSCULAR | Status: DC | PRN
  Administered 2017-03-04: 1 mg via INTRAVENOUS
  Filled 2017-03-03: qty 1

## 2017-03-03 MED ORDER — KCL IN DEXTROSE-NACL 20-5-0.9 MEQ/L-%-% IV SOLN
INTRAVENOUS | Status: DC
  Administered 2017-03-03 – 2017-03-04 (×3): via INTRAVENOUS
  Filled 2017-03-03 (×4): qty 1000

## 2017-03-03 MED ORDER — ORAL CARE MOUTH RINSE
15.0000 mL | Freq: Two times a day (BID) | OROMUCOSAL | Status: DC
  Administered 2017-03-03 – 2017-03-06 (×6): 15 mL via OROMUCOSAL

## 2017-03-03 MED ORDER — PANTOPRAZOLE SODIUM 40 MG IV SOLR
40.0000 mg | INTRAVENOUS | Status: DC
  Administered 2017-03-03 – 2017-03-05 (×3): 40 mg via INTRAVENOUS
  Filled 2017-03-03 (×3): qty 40

## 2017-03-03 NOTE — Progress Notes (Signed)
Subjective: His mother is here this morning He is having no pain or discomfort Has had 2 stools NG output 1400 mL. Urine output adequate WBC 9600.  Potassium 3.8.  Sodium 126.  Creatinine 0.42.  Glucose 124. X-rays still show some dilated small bowel.  There is also lots of stool and some gas throughout colon.  It is not clear to me whether there is a mechanical SBO or this is an ileus  Objective: Vital signs in last 24 hours: Temp:  [98.5 F (36.9 C)-99.4 F (37.4 C)] 99.4 F (37.4 C) (06/09 1023) Pulse Rate:  [102-143] 143 (06/09 1023) Resp:  [14-18] 18 (06/09 1023) BP: (111-142)/(64-106) 130/72 (06/09 1023) SpO2:  [91 %-100 %] 91 % (06/09 1023) FiO2 (%):  [0 %] 0 % (06/08 2252) Weight:  [61.7 kg (136 lb)-61.9 kg (136 lb 8 oz)] 61.9 kg (136 lb 8 oz) (06/08 2301) Last BM Date: 03/02/17  Intake/Output from previous day: 06/08 0701 - 06/09 0700 In: 1450 [I.V.:450; IV Piggyback:1000] Out: 2250 [Urine:800; Emesis/NG output:1450] Intake/Output this shift: Total I/O In: 0  Out: 950 [Urine:950]  General appearance: Awake.  No apparent distress.  Nonverbal.  Tachycardia regular.  Normotensive. Resp: clear to auscultation bilaterally GI: Distended but quite soft and nontender.  No guarding.  No rebound.  No percussion tenderness.  No mass.  No hernias.  Bowel sounds hypoactive  Lab Results:   Recent Labs  03/02/17 1601 03/03/17 0326  WBC 10.8* 9.6  HGB 12.1* 11.1*  HCT 35.4* 32.9*  PLT 132* 176   BMET  Recent Labs  03/02/17 1601 03/03/17 0326  NA 122* 126*  K 3.7 3.8  CL 90* 93*  CO2 22 24  GLUCOSE 117* 124*  BUN 10 8  CREATININE 0.39* 0.42*  CALCIUM 9.0 8.1*   PT/INR No results for input(s): LABPROT, INR in the last 72 hours. ABG No results for input(s): PHART, HCO3 in the last 72 hours.  Invalid input(s): PCO2, PO2  Studies/Results: Ct Abdomen Pelvis W Contrast  Result Date: 03/02/2017 CLINICAL DATA:  Abdominal pain and distention. EXAM: CT ABDOMEN  AND PELVIS WITH CONTRAST TECHNIQUE: Multidetector CT imaging of the abdomen and pelvis was performed using the standard protocol following bolus administration of intravenous contrast. CONTRAST:  <See Chart> ISOVUE-300 IOPAMIDOL (ISOVUE-300) INJECTION 61% COMPARISON:  Acute abdominal series from the same day. FINDINGS: Lower chest: Bilateral lower lobe atelectasis present. The heart size is normal. No significant pleural or pericardial effusion is present. Hepatobiliary: No focal hepatic lesions are present. The common bile duct and gallbladder are within normal limits. Pancreas: Unremarkable. No pancreatic ductal dilatation or surrounding inflammatory changes. Spleen: Normal in size without focal abnormality. Adrenals/Urinary Tract: The adrenal glands are within normal limits bilaterally. The kidneys and ureters are unremarkable. The urinary bladder is mostly collapsed. Stomach/Bowel: The stomach is markedly distended. A fluid level is present. The second and third portions of the duodenum are not distended. Very proximal small bowel is not distended. The remainder the small bowel is distended with multiple fluid levels. Loops of small bowel measure up to 3.6 cm. Very distal terminal ileum is collapsed. There is a rotated segment at the base of the sigmoid colon. This could represent an internal hernia. There is fluid in the proximal colon. Stool is present of the sigmoid colon. The sigmoid colon extends superiorly and to the right without a definite volvulus. The more distal sigmoid colon is less distended. The rectum is unremarkable. Vascular/Lymphatic: No significant vascular findings are present. No  enlarged abdominal or pelvic lymph nodes. Reproductive: Prostate is unremarkable. Other: No abdominal wall hernia or abnormality. No abdominopelvic ascites. Musculoskeletal: No focal lytic or blastic lesions are present. Vertebral body heights and alignment are maintained. IMPRESSION: 1. Distal small bowel  obstruction with multiple dilated loops of small bowel and fluid levels. 2. The very distal terminal ileum is collapsed. The obstruction appears to be at the base of the mesentery. Internal hernia is not excluded. 3. Moderate stool is present within the descending and sigmoid colon. A relatively long segment of sigmoid colon is noted. There is no definite volvulus. Electronically Signed   By: Marin Roberts M.D.   On: 03/02/2017 19:12   Dg Abdomen Acute W/chest  Result Date: 03/02/2017 CLINICAL DATA:  Abdominal distension with pain EXAM: DG ABDOMEN ACUTE W/ 1V CHEST COMPARISON:  08/06/2012 FINDINGS: Sitting view of the chest demonstrates low lung volumes with bibasilar atelectasis and elevation of the right diaphragm. No large effusion. The heart size is within normal limits. No free air beneath the diaphragm. The stomach is markedly enlarged. Dilated small bowel within the central and right upper quadrant up to 4 cm with multiple fluid levels. Bowel gas is present within the colon. Pelvis is incompletely visualized. IMPRESSION: 1. Low lung volumes with bibasilar atelectasis 2. Dilated stomach. Multiple loops of dilated small bowel with gas present in the colon and multiple fluid levels, findings could be secondary to ileus although partial or developing small bowel obstruction is also a concern. Electronically Signed   By: Jasmine Pang M.D.   On: 03/02/2017 18:42   Dg Abd Portable 1v-small Bowel Obstruction Protocol-initial, 8 Hr Delay  Result Date: 03/03/2017 CLINICAL DATA:  41 year old male with small bowel obstruction 8 hour status post enteric contrast administration via NG tube. EXAM: PORTABLE ABDOMEN - 1 VIEW COMPARISON:  03/02/2017 radiographs and CT Abdomen and Pelvis FINDINGS: Portable AP supine view at 0759 hours. Enteric contrast has not emptied from the stomach, remains in the gastric fundus adjacent to the NG tube. Stable gastric distention. Dilated small bowel loops persist up to 5 cm  diameter. Moderate volume of retained stool in the distal colon persists. Urinary bladder distension suspected. No acute osseous abnormality identified. IMPRESSION: 1. All enteric contrast remains in the proximal stomach. Stable NG tube. 2. Stable bowel gas pattern including small bowel obstruction, mild gastric distention, and retained stool in the distal colon. Electronically Signed   By: Odessa Fleming M.D.   On: 03/03/2017 08:16   Dg Abd Portable 1v-small Bowel Protocol-position Verification  Result Date: 03/02/2017 CLINICAL DATA:  NG tube adjustment EXAM: PORTABLE ABDOMEN - 1 VIEW COMPARISON:  03/02/2017 FINDINGS: Esophageal tube is folded upon itself in the proximal stomach. Mild gastric distention remains. Dilated small bowel in the right hemiabdomen, not significantly changed. IMPRESSION: Esophageal tube folded back upon itself over the proximal stomach. No change in dilated loops of small bowel consistent with bowel obstruction. Electronically Signed   By: Jasmine Pang M.D.   On: 03/02/2017 22:19   Dg Abd Portable 1v-small Bowel Protocol-position Verification  Result Date: 03/02/2017 CLINICAL DATA:  NG tube placement. EXAM: PORTABLE ABDOMEN - 1 VIEW COMPARISON:  03/02/2017 CT and radiographs FINDINGS: An NG tube is identified coiled in the proximal stomach. Dilated gas-filled small bowel loops are again noted. IMPRESSION: NG tube coiled in the proximal stomach. Electronically Signed   By: Harmon Pier M.D.   On: 03/02/2017 20:30    Anti-infectives: Anti-infectives    None  Assessment/Plan: Partial SBO versus ileus.  No evidence of compromised bowel at this time.  No acute surgical need. For now continue IV hydration and NG suction. Encouraged ambulation and I discussed this with his mother Repeat lab and abdominal x-rays tomorrow He may require operative intervention if this does not improve  Hyponatremia.  Currently on normal saline IV with potassium.  Evaluation underway by  medicine Autism with explosive personality disorder-takes multiple psychiatric medications and has sitter at bedside.  Ativan IV as needed. Hypertension Asthma   LOS: 1 day    Harland Aguiniga M 03/03/2017

## 2017-03-03 NOTE — Progress Notes (Signed)
Patient admitted to room 6N11 via stretcher accompanied by mother. Alert, VS stable.NGT to left nare. Mother oriented to room and call bell as pt. is autistic and cannot follow commands.

## 2017-03-03 NOTE — Progress Notes (Signed)
PROGRESS NOTE    Ralph Black  ZOX:[id number] DOB: 1975/11/28 DOA: 03/02/2017 PCP: Patient, No Pcp Per   Brief Narrative: 41 y.o. male with medical history significant of severe autism, explosive behavior disorder, asthma presented with abdominal pain, distention and vomiting. In the ER patient was found to have a small bowel obstruction. NG tube was placed. Surgery is following.  Assessment & Plan:  #Small bowel obstruction: Currently has NG tube for decompression and nothing by mouth status. -I will change IV fluid with dextrose and potassium chloride as patient is nothing by mouth. -Added Protonix IV -Continue supportive care -Follow-up general surgery's recommendation, consult appreciated  # Hyponatremia likely due to dehydration. Urine osmolality is high with low sodium. Continue IV fluid. Monitor serum sodium level. Avoid rapid correction. Patient takes desmopressin at home which is on hold.  #Sinus tachycardia: Patient takes metoprolol at home. Increase the dose of metoprolol to 5 mg every 8 hourly. Monitor heart rate and blood pressure closely.  #History of autism, explosive personality disorder: Home medication on hold because of nothing by mouth status. Adjusted the dose of Ativan to 1 mg as needed. Pharmacy was consulted to evaluate if he is home medication can be converted to IV home while nothing by mouth.   Active Problems:   SBO (small bowel obstruction) (HCC)   Hyponatremia   Asthma   Explosive personality disorder   Dehydration   Obstipation   Essential hypertension  DVT prophylaxis: Lovenox subcutaneous Code Status: DO NOT RESUSCITATE DO NOT INTUBATE Family Communication: Discussed with patient's mother at bedside Disposition Plan: Currently admitted  Consultants:   General surgery  Procedures: NG tube Antimicrobials: None  Subjective: Seen and examined at bedside. Denied nausea vomiting but has abdominal discomfort. Review of systems Limited because  of his autism. Mother at bedside  Objective: Vitals:   03/02/17 2301 03/03/17 0220 03/03/17 0611 03/03/17 1023  BP: 135/86 121/64 120/69 130/72  Pulse: (!) 102 (!) 112 (!) 116 (!) 143  Resp: 16 16 16 18   Temp: 99.2 F (37.3 C) 99.1 F (37.3 C) 99 F (37.2 C) 99.4 F (37.4 C)  TempSrc: Axillary Axillary Axillary Axillary  SpO2: 96% 95% 94% 91%  Weight: 61.9 kg (136 lb 8 oz)     Height: 5\' 5"  (1.651 m)       Intake/Output Summary (Last 24 hours) at 03/03/17 1044 Last data filed at 03/03/17 0859  Gross per 24 hour  Intake             1450 ml  Output             3200 ml  Net            -1750 ml   Filed Weights   03/02/17 1555 03/02/17 2301  Weight: 61.7 kg (136 lb) 61.9 kg (136 lb 8 oz)    Examination:  General exam: Appears calm and comfortable , NG tube in place Respiratory system: Clear to auscultation. Respiratory effort normal. No wheezing or crackle Cardiovascular system: S1 & S2 heard, RRR.  No pedal edema. Gastrointestinal system: Abdomen firm, mild distention, sluggish bowel movement Central nervous system: Alert awake  Skin: No rashes, lesions or ulcers Psychiatry: Judgement and insight appear impaired    Data Reviewed: I have personally reviewed following labs and imaging studies  CBC:  Recent Labs Lab 03/02/17 1601 03/03/17 0326  WBC 10.8* 9.6  HGB 12.1* 11.1*  HCT 35.4* 32.9*  MCV 85.3 86.8  PLT 132* 176   Basic  Metabolic Panel:  Recent Labs Lab 03/02/17 1601 03/03/17 0326  NA 122* 126*  K 3.7 3.8  CL 90* 93*  CO2 22 24  GLUCOSE 117* 124*  BUN 10 8  CREATININE 0.39* 0.42*  CALCIUM 9.0 8.1*  MG  --  1.4*  PHOS  --  2.6   GFR: Estimated Creatinine Clearance: 105.7 mL/min (A) (by C-G formula based on SCr of 0.42 mg/dL (L)). Liver Function Tests:  Recent Labs Lab 03/02/17 1601 03/03/17 0326  AST 27 25  ALT 12* 11*  ALKPHOS 45 43  BILITOT 0.7 0.8  PROT 7.1 5.7*  ALBUMIN 3.7 3.2*    Recent Labs Lab 03/02/17 1601  LIPASE  19   No results for input(s): AMMONIA in the last 168 hours. Coagulation Profile: No results for input(s): INR, PROTIME in the last 168 hours. Cardiac Enzymes: No results for input(s): CKTOTAL, CKMB, CKMBINDEX, TROPONINI in the last 168 hours. BNP (last 3 results) No results for input(s): PROBNP in the last 8760 hours. HbA1C: No results for input(s): HGBA1C in the last 72 hours. CBG: No results for input(s): GLUCAP in the last 168 hours. Lipid Profile: No results for input(s): CHOL, HDL, LDLCALC, TRIG, CHOLHDL, LDLDIRECT in the last 72 hours. Thyroid Function Tests:  Recent Labs  03/03/17 0326  TSH 1.983   Anemia Panel: No results for input(s): VITAMINB12, FOLATE, FERRITIN, TIBC, IRON, RETICCTPCT in the last 72 hours. Sepsis Labs: No results for input(s): PROCALCITON, LATICACIDVEN in the last 168 hours.  No results found for this or any previous visit (from the past 240 hour(s)).       Radiology Studies: Ct Abdomen Pelvis W Contrast  Result Date: 03/02/2017 CLINICAL DATA:  Abdominal pain and distention. EXAM: CT ABDOMEN AND PELVIS WITH CONTRAST TECHNIQUE: Multidetector CT imaging of the abdomen and pelvis was performed using the standard protocol following bolus administration of intravenous contrast. CONTRAST:  <See Chart> ISOVUE-300 IOPAMIDOL (ISOVUE-300) INJECTION 61% COMPARISON:  Acute abdominal series from the same day. FINDINGS: Lower chest: Bilateral lower lobe atelectasis present. The heart size is normal. No significant pleural or pericardial effusion is present. Hepatobiliary: No focal hepatic lesions are present. The common bile duct and gallbladder are within normal limits. Pancreas: Unremarkable. No pancreatic ductal dilatation or surrounding inflammatory changes. Spleen: Normal in size without focal abnormality. Adrenals/Urinary Tract: The adrenal glands are within normal limits bilaterally. The kidneys and ureters are unremarkable. The urinary bladder is mostly  collapsed. Stomach/Bowel: The stomach is markedly distended. A fluid level is present. The second and third portions of the duodenum are not distended. Very proximal small bowel is not distended. The remainder the small bowel is distended with multiple fluid levels. Loops of small bowel measure up to 3.6 cm. Very distal terminal ileum is collapsed. There is a rotated segment at the base of the sigmoid colon. This could represent an internal hernia. There is fluid in the proximal colon. Stool is present of the sigmoid colon. The sigmoid colon extends superiorly and to the right without a definite volvulus. The more distal sigmoid colon is less distended. The rectum is unremarkable. Vascular/Lymphatic: No significant vascular findings are present. No enlarged abdominal or pelvic lymph nodes. Reproductive: Prostate is unremarkable. Other: No abdominal wall hernia or abnormality. No abdominopelvic ascites. Musculoskeletal: No focal lytic or blastic lesions are present. Vertebral body heights and alignment are maintained. IMPRESSION: 1. Distal small bowel obstruction with multiple dilated loops of small bowel and fluid levels. 2. The very distal terminal ileum is collapsed.  The obstruction appears to be at the base of the mesentery. Internal hernia is not excluded. 3. Moderate stool is present within the descending and sigmoid colon. A relatively long segment of sigmoid colon is noted. There is no definite volvulus. Electronically Signed   By: Marin Robertshristopher  Mattern M.D.   On: 03/02/2017 19:12   Dg Abdomen Acute W/chest  Result Date: 03/02/2017 CLINICAL DATA:  Abdominal distension with pain EXAM: DG ABDOMEN ACUTE W/ 1V CHEST COMPARISON:  08/06/2012 FINDINGS: Sitting view of the chest demonstrates low lung volumes with bibasilar atelectasis and elevation of the right diaphragm. No large effusion. The heart size is within normal limits. No free air beneath the diaphragm. The stomach is markedly enlarged. Dilated small  bowel within the central and right upper quadrant up to 4 cm with multiple fluid levels. Bowel gas is present within the colon. Pelvis is incompletely visualized. IMPRESSION: 1. Low lung volumes with bibasilar atelectasis 2. Dilated stomach. Multiple loops of dilated small bowel with gas present in the colon and multiple fluid levels, findings could be secondary to ileus although partial or developing small bowel obstruction is also a concern. Electronically Signed   By: Jasmine PangKim  Fujinaga M.D.   On: 03/02/2017 18:42   Dg Abd Portable 1v-small Bowel Obstruction Protocol-initial, 8 Hr Delay  Result Date: 03/03/2017 CLINICAL DATA:  41 year old male with small bowel obstruction 8 hour status post enteric contrast administration via NG tube. EXAM: PORTABLE ABDOMEN - 1 VIEW COMPARISON:  03/02/2017 radiographs and CT Abdomen and Pelvis FINDINGS: Portable AP supine view at 0759 hours. Enteric contrast has not emptied from the stomach, remains in the gastric fundus adjacent to the NG tube. Stable gastric distention. Dilated small bowel loops persist up to 5 cm diameter. Moderate volume of retained stool in the distal colon persists. Urinary bladder distension suspected. No acute osseous abnormality identified. IMPRESSION: 1. All enteric contrast remains in the proximal stomach. Stable NG tube. 2. Stable bowel gas pattern including small bowel obstruction, mild gastric distention, and retained stool in the distal colon. Electronically Signed   By: Odessa FlemingH  Hall M.D.   On: 03/03/2017 08:16   Dg Abd Portable 1v-small Bowel Protocol-position Verification  Result Date: 03/02/2017 CLINICAL DATA:  NG tube adjustment EXAM: PORTABLE ABDOMEN - 1 VIEW COMPARISON:  03/02/2017 FINDINGS: Esophageal tube is folded upon itself in the proximal stomach. Mild gastric distention remains. Dilated small bowel in the right hemiabdomen, not significantly changed. IMPRESSION: Esophageal tube folded back upon itself over the proximal stomach. No change  in dilated loops of small bowel consistent with bowel obstruction. Electronically Signed   By: Jasmine PangKim  Fujinaga M.D.   On: 03/02/2017 22:19   Dg Abd Portable 1v-small Bowel Protocol-position Verification  Result Date: 03/02/2017 CLINICAL DATA:  NG tube placement. EXAM: PORTABLE ABDOMEN - 1 VIEW COMPARISON:  03/02/2017 CT and radiographs FINDINGS: An NG tube is identified coiled in the proximal stomach. Dilated gas-filled small bowel loops are again noted. IMPRESSION: NG tube coiled in the proximal stomach. Electronically Signed   By: Harmon PierJeffrey  Hu M.D.   On: 03/02/2017 20:30        Scheduled Meds: . mouth rinse  15 mL Mouth Rinse BID  . metoprolol tartrate  5 mg Intravenous Q8H  . OLANZapine zydis  15 mg Oral TID  . pantoprazole (PROTONIX) IV  40 mg Intravenous Q24H   Continuous Infusions: . dextrose 5 % and 0.9 % NaCl with KCl 20 mEq/L       LOS: 1 day  Dron Jaynie Collins, MD [facility] Pager [phone]  If 7PM-7AM, please contact night-coverage [email or web address] Password [email or web address] 03/03/2017, 10:44 AM

## 2017-03-04 ENCOUNTER — Inpatient Hospital Stay (HOSPITAL_COMMUNITY): Payer: Medicare Other

## 2017-03-04 DIAGNOSIS — I10 Essential (primary) hypertension: Secondary | ICD-10-CM

## 2017-03-04 LAB — BASIC METABOLIC PANEL
ANION GAP: 8 (ref 5–15)
Anion gap: 7 (ref 5–15)
BUN: <5 mg/dL — ABNORMAL LOW (ref 6–20)
BUN: <5 mg/dL — ABNORMAL LOW (ref 6–20)
CALCIUM: 8.3 mg/dL — AB (ref 8.9–10.3)
CALCIUM: 8.5 mg/dL — AB (ref 8.9–10.3)
CHLORIDE: 110 mmol/L (ref 101–111)
CHLORIDE: 106 mmol/L (ref 101–111)
CO2: 24 mmol/L (ref 22–32)
CO2: 22 mmol/L (ref 22–32)
CREATININE: 0.42 mg/dL — AB (ref 0.61–1.24)
Creatinine, Ser: 0.43 mg/dL — ABNORMAL LOW (ref 0.61–1.24)
GFR calc non Af Amer: >60 mL/min (ref >60)
Glucose, Bld: 123 mg/dL — ABNORMAL HIGH (ref 65–99)
Glucose, Bld: 133 mg/dL — ABNORMAL HIGH (ref 65–99)
Potassium: 4.2 mmol/L (ref 3.5–5.1)
Potassium: 4 mmol/L (ref 3.5–5.1)
SODIUM: 141 mmol/L (ref 135–145)
Sodium: 136 mmol/L (ref 135–145)

## 2017-03-04 LAB — CBC
HEMATOCRIT: 35.2 % — AB (ref 39.0–52.0)
Hemoglobin: 11.3 g/dL — ABNORMAL LOW (ref 13.0–17.0)
MCH: 28.6 pg (ref 26.0–34.0)
MCHC: 32.1 g/dL (ref 30.0–36.0)
MCV: 89.1 fL (ref 78.0–100.0)
Platelets: 171 10*3/uL (ref 150–400)
RBC: 3.95 MIL/uL — ABNORMAL LOW (ref 4.22–5.81)
RDW: 13.8 % (ref 11.5–15.5)
WBC: 8.4 10*3/uL (ref 4.0–10.5)

## 2017-03-04 LAB — MAGNESIUM: Magnesium: 2.3 mg/dL (ref 1.7–2.4)

## 2017-03-04 LAB — GLUCOSE, CAPILLARY
GLUCOSE-CAPILLARY: 107 mg/dL — AB (ref 65–99)
Glucose-Capillary: 123 mg/dL — ABNORMAL HIGH (ref 65–99)
Glucose-Capillary: 137 mg/dL — ABNORMAL HIGH (ref 65–99)
Glucose-Capillary: 101 mg/dL — ABNORMAL HIGH (ref 65–99)

## 2017-03-04 MED ORDER — ENOXAPARIN SODIUM 40 MG/0.4ML ~~LOC~~ SOLN
40.0000 mg | SUBCUTANEOUS | Status: DC
  Administered 2017-03-04 – 2017-03-05 (×2): 40 mg via SUBCUTANEOUS
  Filled 2017-03-04 (×3): qty 0.4

## 2017-03-04 MED ORDER — METOPROLOL TARTRATE 5 MG/5ML IV SOLN
5.0000 mg | Freq: Four times a day (QID) | INTRAVENOUS | Status: DC
  Administered 2017-03-04 – 2017-03-05 (×4): 5 mg via INTRAVENOUS
  Filled 2017-03-04 (×4): qty 5

## 2017-03-04 MED ORDER — ZOLPIDEM TARTRATE 5 MG PO TABS
10.0000 mg | ORAL_TABLET | Freq: Every evening | ORAL | Status: AC | PRN
  Administered 2017-03-04: 10 mg via ORAL
  Filled 2017-03-04: qty 2

## 2017-03-04 MED ORDER — DEXTROSE 5 % IV SOLN
INTRAVENOUS | Status: DC
  Administered 2017-03-04: 08:00:00 via INTRAVENOUS
  Administered 2017-03-04: 1 mL via INTRAVENOUS

## 2017-03-04 NOTE — Progress Notes (Addendum)
Subjective: Improving. NG drainage gradient color, nonbilious Having stools Abdominal x-ray shows contrast has moved into colon and SBO vs. Ileus seems to be resolved  Electrolytes better with sodium 141, potassium 4.2, creatinine 0.4 to, WBC 8400.  His aunt is here today and we had a long talk and discussed his treatment plan.  Objective: Vital signs in last 24 hours: Temp:  [98.6 F (37 C)-100.5 F (38.1 C)] 98.9 F (37.2 C) (06/10 0500) Pulse Rate:  [131-143] 131 (06/10 0500) Resp:  [18-19] 19 (06/10 0500) BP: (116-135)/(68-78) 135/78 (06/10 0500) SpO2:  [91 %-97 %] 96 % (06/10 0500) Last BM Date: 03/03/17  Intake/Output from previous day: 06/09 0701 - 06/10 0700 In: 2138.7 [I.V.:2066.2; NG/GT:20; IV Piggyback:52.5] Out: 4700 [Urine:4350; Emesis/NG output:350] Intake/Output this shift: No intake/output data recorded.  EXAM: General appearance: Awake.  No apparent distress.  Nonverbal.  Tachycardia regular.  Normotensive. HEENT: Normocephalic.  NG tube in place. Resp: clear to auscultation bilaterally GI: Much less distended.  Bowel sounds present.  Nontender.  No guarding.  No mass.  No hernia..   Extremities: Without edema or tenderness.  Lab Results:   Recent Labs  03/03/17 0326 03/04/17 0417  WBC 9.6 8.4  HGB 11.1* 11.3*  HCT 32.9* 35.2*  PLT 176 171   BMET  Recent Labs  03/03/17 0326 03/04/17 0417  NA 126* 141  K 3.8 4.2  CL 93* 110  CO2 24 24  GLUCOSE 124* 123*  BUN 8 <5*  CREATININE 0.42* 0.42*  CALCIUM 8.1* 8.3*   PT/INR No results for input(s): LABPROT, INR in the last 72 hours. ABG No results for input(s): PHART, HCO3 in the last 72 hours.  Invalid input(s): PCO2, PO2  Studies/Results: Ct Abdomen Pelvis W Contrast  Result Date: 03/02/2017 CLINICAL DATA:  Abdominal pain and distention. EXAM: CT ABDOMEN AND PELVIS WITH CONTRAST TECHNIQUE: Multidetector CT imaging of the abdomen and pelvis was performed using the standard protocol  following bolus administration of intravenous contrast. CONTRAST:  <See Chart> ISOVUE-300 IOPAMIDOL (ISOVUE-300) INJECTION 61% COMPARISON:  Acute abdominal series from the same day. FINDINGS: Lower chest: Bilateral lower lobe atelectasis present. The heart size is normal. No significant pleural or pericardial effusion is present. Hepatobiliary: No focal hepatic lesions are present. The common bile duct and gallbladder are within normal limits. Pancreas: Unremarkable. No pancreatic ductal dilatation or surrounding inflammatory changes. Spleen: Normal in size without focal abnormality. Adrenals/Urinary Tract: The adrenal glands are within normal limits bilaterally. The kidneys and ureters are unremarkable. The urinary bladder is mostly collapsed. Stomach/Bowel: The stomach is markedly distended. A fluid level is present. The second and third portions of the duodenum are not distended. Very proximal small bowel is not distended. The remainder the small bowel is distended with multiple fluid levels. Loops of small bowel measure up to 3.6 cm. Very distal terminal ileum is collapsed. There is a rotated segment at the base of the sigmoid colon. This could represent an internal hernia. There is fluid in the proximal colon. Stool is present of the sigmoid colon. The sigmoid colon extends superiorly and to the right without a definite volvulus. The more distal sigmoid colon is less distended. The rectum is unremarkable. Vascular/Lymphatic: No significant vascular findings are present. No enlarged abdominal or pelvic lymph nodes. Reproductive: Prostate is unremarkable. Other: No abdominal wall hernia or abnormality. No abdominopelvic ascites. Musculoskeletal: No focal lytic or blastic lesions are present. Vertebral body heights and alignment are maintained. IMPRESSION: 1. Distal small bowel obstruction with multiple dilated  loops of small bowel and fluid levels. 2. The very distal terminal ileum is collapsed. The obstruction  appears to be at the base of the mesentery. Internal hernia is not excluded. 3. Moderate stool is present within the descending and sigmoid colon. A relatively long segment of sigmoid colon is noted. There is no definite volvulus. Electronically Signed   By: Marin Robertshristopher  Mattern M.D.   On: 03/02/2017 19:12   Dg Abd 2 Views  Result Date: 03/04/2017 CLINICAL DATA:  Small bowel obstruction. EXAM: ABDOMEN - 2 VIEW COMPARISON:  03/03/2017 and prior exams FINDINGS: Enteric contrast is now noted within the colon. An NG tube is present with tip overlying the proximal stomach. Distended small bowel loops are again identified. No other changes noted. IMPRESSION: Enteric contrast has progressed into the colon since 03/03/2017 suggesting resolution of or partial small bowel obstruction. Electronically Signed   By: Harmon PierJeffrey  Hu M.D.   On: 03/04/2017 07:50   Dg Abdomen Acute W/chest  Result Date: 03/02/2017 CLINICAL DATA:  Abdominal distension with pain EXAM: DG ABDOMEN ACUTE W/ 1V CHEST COMPARISON:  08/06/2012 FINDINGS: Sitting view of the chest demonstrates low lung volumes with bibasilar atelectasis and elevation of the right diaphragm. No large effusion. The heart size is within normal limits. No free air beneath the diaphragm. The stomach is markedly enlarged. Dilated small bowel within the central and right upper quadrant up to 4 cm with multiple fluid levels. Bowel gas is present within the colon. Pelvis is incompletely visualized. IMPRESSION: 1. Low lung volumes with bibasilar atelectasis 2. Dilated stomach. Multiple loops of dilated small bowel with gas present in the colon and multiple fluid levels, findings could be secondary to ileus although partial or developing small bowel obstruction is also a concern. Electronically Signed   By: Jasmine PangKim  Fujinaga M.D.   On: 03/02/2017 18:42   Dg Abd Portable 1v-small Bowel Obstruction Protocol-initial, 8 Hr Delay  Result Date: 03/03/2017 CLINICAL DATA:  41 year old male with  small bowel obstruction 8 hour status post enteric contrast administration via NG tube. EXAM: PORTABLE ABDOMEN - 1 VIEW COMPARISON:  03/02/2017 radiographs and CT Abdomen and Pelvis FINDINGS: Portable AP supine view at 0759 hours. Enteric contrast has not emptied from the stomach, remains in the gastric fundus adjacent to the NG tube. Stable gastric distention. Dilated small bowel loops persist up to 5 cm diameter. Moderate volume of retained stool in the distal colon persists. Urinary bladder distension suspected. No acute osseous abnormality identified. IMPRESSION: 1. All enteric contrast remains in the proximal stomach. Stable NG tube. 2. Stable bowel gas pattern including small bowel obstruction, mild gastric distention, and retained stool in the distal colon. Electronically Signed   By: Odessa FlemingH  Hall M.D.   On: 03/03/2017 08:16   Dg Abd Portable 1v-small Bowel Protocol-position Verification  Result Date: 03/02/2017 CLINICAL DATA:  NG tube adjustment EXAM: PORTABLE ABDOMEN - 1 VIEW COMPARISON:  03/02/2017 FINDINGS: Esophageal tube is folded upon itself in the proximal stomach. Mild gastric distention remains. Dilated small bowel in the right hemiabdomen, not significantly changed. IMPRESSION: Esophageal tube folded back upon itself over the proximal stomach. No change in dilated loops of small bowel consistent with bowel obstruction. Electronically Signed   By: Jasmine PangKim  Fujinaga M.D.   On: 03/02/2017 22:19   Dg Abd Portable 1v-small Bowel Protocol-position Verification  Result Date: 03/02/2017 CLINICAL DATA:  NG tube placement. EXAM: PORTABLE ABDOMEN - 1 VIEW COMPARISON:  03/02/2017 CT and radiographs FINDINGS: An NG tube is identified coiled in the  proximal stomach. Dilated gas-filled small bowel loops are again noted. IMPRESSION: NG tube coiled in the proximal stomach. Electronically Signed   By: Harmon Pier M.D.   On: 03/02/2017 20:30    Anti-infectives: Anti-infectives    None       Assessment/Plan:  Partial SBO versus ileus.   This is resolving clinically and radiographically Discontinue NG tube and start clear liquid diet No narcotics Ambulate more  Hyponatremia.   resolved.  Currently on normal saline IV with potassium.  Evaluation underway by medicine Autism with explosive personality disorder-takes multiple psychiatric medications and has sitter at bedside.  Ativan IV as needed. Hypertension Asthma    LOS: 2 days    Meesha Sek M 03/04/2017

## 2017-03-04 NOTE — Progress Notes (Signed)
Pt ambulated a full lap on unit this pm 

## 2017-03-04 NOTE — Progress Notes (Signed)
PROGRESS NOTE    Ralph Black  XBJ:[id number] DOB: 15-Feb-1976 DOA: 03/02/2017 PCP: Patient, No Pcp Per   Brief Narrative: 41 y.o. male with medical history significant of severe autism, explosive behavior disorder, asthma presented with abdominal pain, distention and vomiting. In the ER patient was found to have a small bowel obstruction. NG tube was placed. Surgery is following.  Assessment & Plan:  #Small bowel obstruction: -Abdominal x-ray with improvement in obstruction. NG tube was removed and he started clear liquids by surgery. Continue Protonix IV and other IV medication today. Plan to switch to oral medication tomorrow if patient can take orally without any difficulties. Surgery consult appreciated. Noted mild temperature last night. Continue to monitor.  # Hyponatremia likely due to dehydration. Urine osmolality is high with low sodium. Serum sodium level elevated significantly therefore discontinue current IV fluid and switched to D5W. Monitor BMP twice a day. Patient takes desmopressin at home. May need to resume depending on repeat labs.  #Sinus tachycardia: Patient takes metoprolol at home. I increased the dose of metoprolol to every 6 hourly. Monitor BMP and heart rate.   #History of autism, explosive personality disorder: Home medication on hold because of nothing by mouth status. Adjusted the dose of Ativan to 1 mg as needed. Patient is calm and comfortable. Discussed with the patient's aunt and bedside in detail. Continue current management for now. Possibly able to switch to oral medication tomorrow if patient can tolerate orally.  Active Problems:   SBO (small bowel obstruction) (HCC)   Hyponatremia   Asthma   Explosive personality disorder   Dehydration   Obstipation   Essential hypertension  DVT prophylaxis: Lovenox subcutaneous Code Status: DO NOT RESUSCITATE DO NOT INTUBATE Family Communication: Discussed with patient's aunt at bedside Disposition Plan:  Currently admitted  Consultants:   General surgery  Procedures: NG tube Antimicrobials: None  Subjective: Seen and examined at bedside. Patient was calm and comfortable. NG tube is out. Patient's aunt bedside. Objective: Vitals:   03/03/17 1023 03/03/17 1444 03/03/17 1952 03/04/17 0500  BP: 130/72 116/68 135/68 135/78  Pulse: (!) 143 (!) 135 (!) 140 (!) 131  Resp: 18 18 19 19   Temp: 99.4 F (37.4 C) 98.6 F (37 C) (!) 100.5 F (38.1 C) 98.9 F (37.2 C)  TempSrc: Axillary Axillary Axillary Axillary  SpO2: 91% 96% 97% 96%  Weight:      Height:        Intake/Output Summary (Last 24 hours) at 03/04/17 1201 Last data filed at 03/04/17 1029  Gross per 24 hour  Intake          2338.67 ml  Output             3950 ml  Net         -1611.33 ml   Filed Weights   03/02/17 1555 03/02/17 2301  Weight: 61.7 kg (136 lb) 61.9 kg (136 lb 8 oz)    Examination:  General exam: Lying on bed comfortable, not in distress, NG tube is out Respiratory system: Clear to auscultation. Respiratory effort normal. No wheezing or crackle Cardiovascular system: S1 & S2 heard, tachycardic and regular.  No pedal edema. Gastrointestinal system: Abdomen soft, not distended. Bowel sound positive Central nervous system: Alert awake  Skin: No rashes, lesions or ulcers Psychiatry: Judgement and insight appear impaired    Data Reviewed: I have personally reviewed following labs and imaging studies  CBC:  Recent Labs Lab 03/02/17 1601 03/03/17 0326 03/04/17 0417  WBC 10.8*  9.6 8.4  HGB 12.1* 11.1* 11.3*  HCT 35.4* 32.9* 35.2*  MCV 85.3 86.8 89.1  PLT 132* 176 171   Basic Metabolic Panel:  Recent Labs Lab 03/02/17 1601 03/03/17 0326 03/04/17 0417  NA 122* 126* 141  K 3.7 3.8 4.2  CL 90* 93* 110  CO2 22 24 24   GLUCOSE 117* 124* 123*  BUN 10 8 <5*  CREATININE 0.39* 0.42* 0.42*  CALCIUM 9.0 8.1* 8.3*  MG  --  1.4* 2.3  PHOS  --  2.6  --    GFR: Estimated Creatinine Clearance:  105.7 mL/min (A) (by C-G formula based on SCr of 0.42 mg/dL (L)). Liver Function Tests:  Recent Labs Lab 03/02/17 1601 03/03/17 0326  AST 27 25  ALT 12* 11*  ALKPHOS 45 43  BILITOT 0.7 0.8  PROT 7.1 5.7*  ALBUMIN 3.7 3.2*    Recent Labs Lab 03/02/17 1601  LIPASE 19   No results for input(s): AMMONIA in the last 168 hours. Coagulation Profile: No results for input(s): INR, PROTIME in the last 168 hours. Cardiac Enzymes: No results for input(s): CKTOTAL, CKMB, CKMBINDEX, TROPONINI in the last 168 hours. BNP (last 3 results) No results for input(s): PROBNP in the last 8760 hours. HbA1C: No results for input(s): HGBA1C in the last 72 hours. CBG:  Recent Labs Lab 03/03/17 1626 03/03/17 1950 03/04/17 0001 03/04/17 0402 03/04/17 0744  GLUCAP 147* 146* 124* 123* 137*   Lipid Profile: No results for input(s): CHOL, HDL, LDLCALC, TRIG, CHOLHDL, LDLDIRECT in the last 72 hours. Thyroid Function Tests:  Recent Labs  03/03/17 0326  TSH 1.983   Anemia Panel: No results for input(s): VITAMINB12, FOLATE, FERRITIN, TIBC, IRON, RETICCTPCT in the last 72 hours. Sepsis Labs: No results for input(s): PROCALCITON, LATICACIDVEN in the last 168 hours.  No results found for this or any previous visit (from the past 240 hour(s)).       Radiology Studies: Ct Abdomen Pelvis W Contrast  Result Date: 03/02/2017 CLINICAL DATA:  Abdominal pain and distention. EXAM: CT ABDOMEN AND PELVIS WITH CONTRAST TECHNIQUE: Multidetector CT imaging of the abdomen and pelvis was performed using the standard protocol following bolus administration of intravenous contrast. CONTRAST:  <See Chart> ISOVUE-300 IOPAMIDOL (ISOVUE-300) INJECTION 61% COMPARISON:  Acute abdominal series from the same day. FINDINGS: Lower chest: Bilateral lower lobe atelectasis present. The heart size is normal. No significant pleural or pericardial effusion is present. Hepatobiliary: No focal hepatic lesions are present. The  common bile duct and gallbladder are within normal limits. Pancreas: Unremarkable. No pancreatic ductal dilatation or surrounding inflammatory changes. Spleen: Normal in size without focal abnormality. Adrenals/Urinary Tract: The adrenal glands are within normal limits bilaterally. The kidneys and ureters are unremarkable. The urinary bladder is mostly collapsed. Stomach/Bowel: The stomach is markedly distended. A fluid level is present. The second and third portions of the duodenum are not distended. Very proximal small bowel is not distended. The remainder the small bowel is distended with multiple fluid levels. Loops of small bowel measure up to 3.6 cm. Very distal terminal ileum is collapsed. There is a rotated segment at the base of the sigmoid colon. This could represent an internal hernia. There is fluid in the proximal colon. Stool is present of the sigmoid colon. The sigmoid colon extends superiorly and to the right without a definite volvulus. The more distal sigmoid colon is less distended. The rectum is unremarkable. Vascular/Lymphatic: No significant vascular findings are present. No enlarged abdominal or pelvic lymph nodes. Reproductive: Prostate  is unremarkable. Other: No abdominal wall hernia or abnormality. No abdominopelvic ascites. Musculoskeletal: No focal lytic or blastic lesions are present. Vertebral body heights and alignment are maintained. IMPRESSION: 1. Distal small bowel obstruction with multiple dilated loops of small bowel and fluid levels. 2. The very distal terminal ileum is collapsed. The obstruction appears to be at the base of the mesentery. Internal hernia is not excluded. 3. Moderate stool is present within the descending and sigmoid colon. A relatively long segment of sigmoid colon is noted. There is no definite volvulus. Electronically Signed   By: Marin Roberts M.D.   On: 03/02/2017 19:12   Dg Abd 2 Views  Result Date: 03/04/2017 CLINICAL DATA:  Small bowel  obstruction. EXAM: ABDOMEN - 2 VIEW COMPARISON:  03/03/2017 and prior exams FINDINGS: Enteric contrast is now noted within the colon. An NG tube is present with tip overlying the proximal stomach. Distended small bowel loops are again identified. No other changes noted. IMPRESSION: Enteric contrast has progressed into the colon since 03/03/2017 suggesting resolution of or partial small bowel obstruction. Electronically Signed   By: Harmon Pier M.D.   On: 03/04/2017 07:50   Dg Abdomen Acute W/chest  Result Date: 03/02/2017 CLINICAL DATA:  Abdominal distension with pain EXAM: DG ABDOMEN ACUTE W/ 1V CHEST COMPARISON:  08/06/2012 FINDINGS: Sitting view of the chest demonstrates low lung volumes with bibasilar atelectasis and elevation of the right diaphragm. No large effusion. The heart size is within normal limits. No free air beneath the diaphragm. The stomach is markedly enlarged. Dilated small bowel within the central and right upper quadrant up to 4 cm with multiple fluid levels. Bowel gas is present within the colon. Pelvis is incompletely visualized. IMPRESSION: 1. Low lung volumes with bibasilar atelectasis 2. Dilated stomach. Multiple loops of dilated small bowel with gas present in the colon and multiple fluid levels, findings could be secondary to ileus although partial or developing small bowel obstruction is also a concern. Electronically Signed   By: Jasmine Pang M.D.   On: 03/02/2017 18:42   Dg Abd Portable 1v-small Bowel Obstruction Protocol-initial, 8 Hr Delay  Result Date: 03/03/2017 CLINICAL DATA:  41 year old male with small bowel obstruction 8 hour status post enteric contrast administration via NG tube. EXAM: PORTABLE ABDOMEN - 1 VIEW COMPARISON:  03/02/2017 radiographs and CT Abdomen and Pelvis FINDINGS: Portable AP supine view at 0759 hours. Enteric contrast has not emptied from the stomach, remains in the gastric fundus adjacent to the NG tube. Stable gastric distention. Dilated small  bowel loops persist up to 5 cm diameter. Moderate volume of retained stool in the distal colon persists. Urinary bladder distension suspected. No acute osseous abnormality identified. IMPRESSION: 1. All enteric contrast remains in the proximal stomach. Stable NG tube. 2. Stable bowel gas pattern including small bowel obstruction, mild gastric distention, and retained stool in the distal colon. Electronically Signed   By: Odessa Fleming M.D.   On: 03/03/2017 08:16   Dg Abd Portable 1v-small Bowel Protocol-position Verification  Result Date: 03/02/2017 CLINICAL DATA:  NG tube adjustment EXAM: PORTABLE ABDOMEN - 1 VIEW COMPARISON:  03/02/2017 FINDINGS: Esophageal tube is folded upon itself in the proximal stomach. Mild gastric distention remains. Dilated small bowel in the right hemiabdomen, not significantly changed. IMPRESSION: Esophageal tube folded back upon itself over the proximal stomach. No change in dilated loops of small bowel consistent with bowel obstruction. Electronically Signed   By: Jasmine Pang M.D.   On: 03/02/2017 22:19   Dg  Abd Portable 1v-small Bowel Protocol-position Verification  Result Date: 03/02/2017 CLINICAL DATA:  NG tube placement. EXAM: PORTABLE ABDOMEN - 1 VIEW COMPARISON:  03/02/2017 CT and radiographs FINDINGS: An NG tube is identified coiled in the proximal stomach. Dilated gas-filled small bowel loops are again noted. IMPRESSION: NG tube coiled in the proximal stomach. Electronically Signed   By: Harmon Pier M.D.   On: 03/02/2017 20:30        Scheduled Meds: . mouth rinse  15 mL Mouth Rinse BID  . metoprolol tartrate  5 mg Intravenous Q6H  . OLANZapine zydis  15 mg Oral TID  . pantoprazole (PROTONIX) IV  40 mg Intravenous Q24H   Continuous Infusions: . dextrose 75 mL/hr at 03/04/17 0819  . valproate sodium 250 mg (03/04/17 1100)     LOS: 2 days    Yuuki Skeens Jaynie Collins, MD [facility] Pager [phone]  If 7PM-7AM, please contact  night-coverage [email or web address] Password [email or web address] 03/04/2017, 12:01 PM

## 2017-03-05 LAB — GLUCOSE, CAPILLARY
GLUCOSE-CAPILLARY: 136 mg/dL — AB (ref 65–99)
GLUCOSE-CAPILLARY: 120 mg/dL — AB (ref 65–99)
GLUCOSE-CAPILLARY: 119 mg/dL — AB (ref 65–99)
Glucose-Capillary: 86 mg/dL (ref 65–99)
Glucose-Capillary: 91 mg/dL (ref 65–99)
Glucose-Capillary: 93 mg/dL (ref 65–99)

## 2017-03-05 LAB — BASIC METABOLIC PANEL
Anion gap: 7 (ref 5–15)
BUN: <5 mg/dL — ABNORMAL LOW (ref 6–20)
CALCIUM: 8.8 mg/dL — AB (ref 8.9–10.3)
CO2: 25 mmol/L (ref 22–32)
CREATININE: 0.33 mg/dL — AB (ref 0.61–1.24)
Chloride: 108 mmol/L (ref 101–111)
GFR calc non Af Amer: >60 mL/min (ref >60)
Glucose, Bld: 95 mg/dL (ref 65–99)
Potassium: 3.9 mmol/L (ref 3.5–5.1)
SODIUM: 140 mmol/L (ref 135–145)

## 2017-03-05 MED ORDER — QUETIAPINE FUMARATE 100 MG PO TABS
200.0000 mg | ORAL_TABLET | Freq: Every day | ORAL | Status: DC
  Administered 2017-03-05: 200 mg via ORAL
  Filled 2017-03-05: qty 2

## 2017-03-05 MED ORDER — LORAZEPAM 2 MG/ML IJ SOLN: 1.0000 mg | Freq: Three times a day (TID) | INTRAMUSCULAR | Status: DC | PRN

## 2017-03-05 MED ORDER — METOPROLOL TARTRATE 25 MG PO TABS
25.0000 mg | ORAL_TABLET | Freq: Every day | ORAL | Status: DC
  Administered 2017-03-05 – 2017-03-06 (×2): 25 mg via ORAL
  Filled 2017-03-05 (×2): qty 1

## 2017-03-05 MED ORDER — OLANZAPINE 5 MG PO TBDP
15.0000 mg | ORAL_TABLET | Freq: Every day | ORAL | Status: DC
  Administered 2017-03-06: 15 mg via ORAL
  Filled 2017-03-05: qty 3

## 2017-03-05 MED ORDER — BISACODYL 10 MG RE SUPP
10.0000 mg | Freq: Every day | RECTAL | Status: DC
  Administered 2017-03-05 – 2017-03-06 (×2): 10 mg via RECTAL
  Filled 2017-03-05 (×2): qty 1

## 2017-03-05 NOTE — Progress Notes (Signed)
PROGRESS NOTE    Ralph Black  RUE:[id number] DOB: Feb 18, 1976 DOA: 03/02/2017 PCP: Patient, No Pcp Per   Brief Narrative: 41 y.o. male with medical history significant of severe autism, explosive behavior disorder, asthma presented with abdominal pain, distention and vomiting. In the ER patient was found to have a small bowel obstruction. NG tube was placed. Surgery is following.  Assessment & Plan:  #Small bowel obstruction: -Abdominal x-ray with improvement in obstruction. Plan for full liquid diet today. Tolerating clear liquid diet. Surgery consult appreciated. Continue current medical and supportive care.  # Hyponatremia likely due to dehydration. Improved.  #Sinus tachycardia: Resume oral metoprolol today.  #History of autism, explosive personality disorder: Continue Ativan as needed and resuming home medications slowly. Continue Depakote, added Seroquel.. Patient was sleeping this morning.  Active Problems:   SBO (small bowel obstruction) (HCC)   Hyponatremia   Asthma   Explosive personality disorder   Dehydration   Obstipation   Essential hypertension  DVT prophylaxis: Lovenox subcutaneous Code Status: DO NOT RESUSCITATE DO NOT INTUBATE Family Communication: Discussed with patient's sister and caregiver at bedside Disposition Plan: Currently admitted  Consultants:   General surgery  Procedures: NG tube Antimicrobials: None  Subjective: Seen and examined at bedside. Patient was sleeping comfortably. Review of systems Limited. Objective: Vitals:   03/03/17 1952 03/04/17 0500 03/04/17 1425 03/05/17 0636  BP: 135/68 135/78 (!) 148/90 116/73  Pulse: (!) 140 (!) 131 (!) 117 97  Resp: 19 19 18 18   Temp: (!) 100.5 F (38.1 C) 98.9 F (37.2 C) 98.4 F (36.9 C) 97.7 F (36.5 C)  TempSrc: Axillary Axillary Oral Axillary  SpO2: 97% 96% 97% 100%  Weight:      Height:        Intake/Output Summary (Last 24 hours) at 03/05/17 1137 Last data filed at 03/05/17  1048  Gross per 24 hour  Intake          2436.67 ml  Output             1900 ml  Net           536.67 ml   Filed Weights   03/02/17 1555 03/02/17 2301  Weight: 61.7 kg (136 lb) 61.9 kg (136 lb 8 oz)    Examination:  General exam: Lying on bed comfortable, not in distress. Respiratory system: Clear to auscultation. Respiratory effort normal. No wheezing or crackle Cardiovascular system: Regular rate rhythm, S1-S2 normal. No pedal edema. Gastrointestinal system: Soft, nontender. Bowel sound positive Central nervous system: Alert awake  Skin: No rashes, lesions or ulcers Psychiatry: Judgement and insight appear impaired    Data Reviewed: I have personally reviewed following labs and imaging studies  CBC:  Recent Labs Lab 03/02/17 1601 03/03/17 0326 03/04/17 0417  WBC 10.8* 9.6 8.4  HGB 12.1* 11.1* 11.3*  HCT 35.4* 32.9* 35.2*  MCV 85.3 86.8 89.1  PLT 132* 176 171   Basic Metabolic Panel:  Recent Labs Lab 03/02/17 1601 03/03/17 0326 03/04/17 0417 03/04/17 1815 03/05/17 0518  NA 122* 126* 141 136 140  K 3.7 3.8 4.2 4.0 3.9  CL 90* 93* 110 106 108  CO2 22 24 24 22 25   GLUCOSE 117* 124* 123* 133* 95  BUN 10 8 <5* <5* <5*  CREATININE 0.39* 0.42* 0.42* 0.43* 0.33*  CALCIUM 9.0 8.1* 8.3* 8.5* 8.8*  MG  --  1.4* 2.3  --   --   PHOS  --  2.6  --   --   --  GFR: Estimated Creatinine Clearance: 105.7 mL/min (A) (by C-G formula based on SCr of 0.33 mg/dL (L)). Liver Function Tests:  Recent Labs Lab 03/02/17 1601 03/03/17 0326  AST 27 25  ALT 12* 11*  ALKPHOS 45 43  BILITOT 0.7 0.8  PROT 7.1 5.7*  ALBUMIN 3.7 3.2*    Recent Labs Lab 03/02/17 1601  LIPASE 19   No results for input(s): AMMONIA in the last 168 hours. Coagulation Profile: No results for input(s): INR, PROTIME in the last 168 hours. Cardiac Enzymes: No results for input(s): CKTOTAL, CKMB, CKMBINDEX, TROPONINI in the last 168 hours. BNP (last 3 results) No results for input(s): PROBNP  in the last 8760 hours. HbA1C: No results for input(s): HGBA1C in the last 72 hours. CBG:  Recent Labs Lab 03/04/17 1200 03/04/17 1609 03/04/17 1939 03/05/17 0028 03/05/17 1005  GLUCAP 136* 101* 107* 93 86   Lipid Profile: No results for input(s): CHOL, HDL, LDLCALC, TRIG, CHOLHDL, LDLDIRECT in the last 72 hours. Thyroid Function Tests:  Recent Labs  03/03/17 0326  TSH 1.983   Anemia Panel: No results for input(s): VITAMINB12, FOLATE, FERRITIN, TIBC, IRON, RETICCTPCT in the last 72 hours. Sepsis Labs: No results for input(s): PROCALCITON, LATICACIDVEN in the last 168 hours.  No results found for this or any previous visit (from the past 240 hour(s)).       Radiology Studies: Dg Abd 2 Views  Result Date: 03/04/2017 CLINICAL DATA:  Small bowel obstruction. EXAM: ABDOMEN - 2 VIEW COMPARISON:  03/03/2017 and prior exams FINDINGS: Enteric contrast is now noted within the colon. An NG tube is present with tip overlying the proximal stomach. Distended small bowel loops are again identified. No other changes noted. IMPRESSION: Enteric contrast has progressed into the colon since 03/03/2017 suggesting resolution of or partial small bowel obstruction. Electronically Signed   By: Harmon Pier M.D.   On: 03/04/2017 07:50        Scheduled Meds: . bisacodyl  10 mg Rectal Daily  . enoxaparin (LOVENOX) injection  40 mg Subcutaneous Q24H  . mouth rinse  15 mL Mouth Rinse BID  . metoprolol tartrate  5 mg Intravenous Q6H  . OLANZapine zydis  15 mg Oral TID  . pantoprazole (PROTONIX) IV  40 mg Intravenous Q24H  . QUEtiapine  200 mg Oral QHS   Continuous Infusions: . valproate sodium Stopped (03/05/17 1123)     LOS: 3 days    Tzipporah Nagorski Jaynie Collins, MD [facility] Pager [phone]  If 7PM-7AM, please contact night-coverage [email or web address] Password [facility] 03/05/2017, 11:37 AM

## 2017-03-05 NOTE — Progress Notes (Signed)
[facility] Progress Note      Subjective:  CC: abdominal pain  Family at bedside. Pt nonverbal. Tolerating clears. Not complaining of abdominal pain. No BM since admission and unsure if having flatus.   Objective: Vital signs in last 24 hours: Temp:  [97.7 F (36.5 C)-98.4 F (36.9 C)] 97.7 F (36.5 C) (06/11 0636) Pulse Rate:  [97-117] 97 (06/11 0636) Resp:  [18] 18 (06/11 0636) BP: (116-148)/(73-90) 116/73 (06/11 0636) SpO2:  [97 %-100 %] 100 % (06/11 0636) Last BM Date: 03/03/17  Intake/Output from previous day: 06/10 0701 - 06/11 0700 In: 2176.7 [P.O.:890; I.V.:1129.2; IV Piggyback:157.5] Out: 1350 [Urine:1350] Intake/Output this shift: Total I/O In: 460 [P.O.:460] Out: 750 [Urine:750]  PE: Gen:  Alert, NAD, pleasant, cooperative, well appearing, nonverbal Card:  RRR, no M/G/R heard Pulm:  CTA, no W/R/R, effort normal Abd: Soft, NT/ND, +BS, no hernia noted Skin: no rashes noted, warm and dry  Lab Results:   Recent Labs  03/03/17 0326 03/04/17 0417  WBC 9.6 8.4  HGB 11.1* 11.3*  HCT 32.9* 35.2*  PLT 176 171   BMET  Recent Labs  03/04/17 1815 03/05/17 0518  NA 136 140  K 4.0 3.9  CL 106 108  CO2 22 25  GLUCOSE 133* 95  BUN <5* <5*  CREATININE 0.43* 0.33*  CALCIUM 8.5* 8.8*   PT/INR No results for input(s): LABPROT, INR in the last 72 hours. CMP     Component Value Date/Time   NA 140 03/05/2017 0518   K 3.9 03/05/2017 0518   CL 108 03/05/2017 0518   CO2 25 03/05/2017 0518   GLUCOSE 95 03/05/2017 0518   BUN <5 (L) 03/05/2017 0518   CREATININE 0.33 (L) 03/05/2017 0518   CALCIUM 8.8 (L) 03/05/2017 0518   PROT 5.7 (L) 03/03/2017 0326   ALBUMIN 3.2 (L) 03/03/2017 0326   AST 25 03/03/2017 0326   ALT 11 (L) 03/03/2017 0326   ALKPHOS 43 03/03/2017 0326   BILITOT 0.8 03/03/2017 0326   GFRNONAA >60 03/05/2017 0518   GFRAA >60 03/05/2017 0518   Lipase     Component Value Date/Time   LIPASE 19 03/02/2017 1601     Studies/Results: Dg Abd 2 Views  Result Date: 03/04/2017 CLINICAL DATA:  Small bowel obstruction. EXAM: ABDOMEN - 2 VIEW COMPARISON:  03/03/2017 and prior exams FINDINGS: Enteric contrast is now noted within the colon. An NG tube is present with tip overlying the proximal stomach. Distended small bowel loops are again identified. No other changes noted. IMPRESSION: Enteric contrast has progressed into the colon since 03/03/2017 suggesting resolution of or partial small bowel obstruction. Electronically Signed   By: Harmon PierJeffrey  Hu M.D.   On: 03/04/2017 07:50    Anti-infectives: Anti-infectives    None       Assessment/Plan Hyponatremia.  resolved.  Autism with explosive personality disorder-takes multiple psychiatric medications and has sitter at bedside. Ativan IV as needed. Hypertension Asthma  Partial SBO versus ileus.  This is resolving clinically and radiographically Tolerating clears No narcotics Ambulate more  FEN: fulls VTE: lovenox ID: none  DISPO: appears that his SBO is resolving. Advance diet as tolerated. We will continue to follow this patient.    LOS: 3 days    Jerre SimonJessica L Focht , [facility] 03/05/2017, 10:57 AM Pager: [phone] Consults: [phone] Mon-Fri 7:00 am-4:30 pm Sat-Sun 7:00 am-11:30 am

## 2017-03-06 LAB — GLUCOSE, CAPILLARY
GLUCOSE-CAPILLARY: 96 mg/dL (ref 65–99)
GLUCOSE-CAPILLARY: 116 mg/dL — AB (ref 65–99)
Glucose-Capillary: 97 mg/dL (ref 65–99)

## 2017-03-06 MED ORDER — PANTOPRAZOLE SODIUM 40 MG PO PACK
40.0000 mg | PACK | Freq: Every day | ORAL | Status: DC
  Filled 2017-03-06: qty 20

## 2017-03-06 MED ORDER — OLANZAPINE 15 MG PO TABS: 15.0000 mg | ORAL_TABLET | Freq: Every day | ORAL | Status: AC

## 2017-03-06 MED ORDER — DIVALPROEX SODIUM 250 MG PO DR TAB
250.0000 mg | DELAYED_RELEASE_TABLET | Freq: Two times a day (BID) | ORAL | Status: DC
  Administered 2017-03-06: 250 mg via ORAL
  Filled 2017-03-06 (×2): qty 1

## 2017-03-06 NOTE — Progress Notes (Signed)
Pharmacy had been asked to determine patient's PTA medications.  A colleague called and confirmed with group home patient is taking both high dose Zyprexa 15mg  po TID + Seroquel 200 po QHS. These are in the same class and pose a risk of QT prolongation.   The dose of Zyprexa is very high (typically a once daily medication based on long half-life of ~30hrs). Per group home, these are prescribed by Dr. Midge AverPavelock ([facility]).    *Please consider close monitoring of QTc. *Please evaluate if duplicate therapy of Zyprexa and Seroquel should be changed to just one or the other *Please consider changing Zyprexa frequency to once daily due to long t1/2 and risk of accumulation. A Psych consult may help for safety in this situation.

## 2017-03-06 NOTE — Progress Notes (Signed)
[facility] Progress Note     Subjective: CC:  No complaints. 2 large BMs yeterday. Tolerating full liquids without nausea or vomiting. Denies abdominal pain. Still mild distention.   Objective: Vital signs in last 24 hours: Temp:  [97.9 F (36.6 C)-98 F (36.7 C)] 98 F (36.7 C) (06/12 0532) Pulse Rate:  [89-96] 89 (06/12 0532) Resp:  [17-18] 17 (06/12 0532) BP: (118-124)/(72-78) 118/72 (06/12 0532) SpO2:  [99 %-100 %] 99 % (06/12 0532) Last BM Date: 03/05/17  Intake/Output from previous day: 06/11 0701 - 06/12 0700 In: 1980 [P.O.:1980] Out: 2425 [Urine:2425] Intake/Output this shift: No intake/output data recorded.  PE: Gen:  Alert, NAD, pleasant Card:  Regular rate and rhythm, pedal pulses 2+ BL Pulm:  Normal effort, clear to auscultation bilaterally Abd: Soft, non-tender, mild distention and tympany, bowel sounds present in all 4 quadrants Skin: warm and dry, no rashes  Psych: A&Ox3   Lab Results:   Recent Labs  03/04/17 0417  WBC 8.4  HGB 11.3*  HCT 35.2*  PLT 171   BMET  Recent Labs  03/04/17 1815 03/05/17 0518  NA 136 140  K 4.0 3.9  CL 106 108  CO2 22 25  GLUCOSE 133* 95  BUN <5* <5*  CREATININE 0.43* 0.33*  CALCIUM 8.5* 8.8*   PT/INR No results for input(s): LABPROT, INR in the last 72 hours. CMP     Component Value Date/Time   NA 140 03/05/2017 0518   K 3.9 03/05/2017 0518   CL 108 03/05/2017 0518   CO2 25 03/05/2017 0518   GLUCOSE 95 03/05/2017 0518   BUN <5 (L) 03/05/2017 0518   CREATININE 0.33 (L) 03/05/2017 0518   CALCIUM 8.8 (L) 03/05/2017 0518   PROT 5.7 (L) 03/03/2017 0326   ALBUMIN 3.2 (L) 03/03/2017 0326   AST 25 03/03/2017 0326   ALT 11 (L) 03/03/2017 0326   ALKPHOS 43 03/03/2017 0326   BILITOT 0.8 03/03/2017 0326   GFRNONAA >60 03/05/2017 0518   GFRAA >60 03/05/2017 0518   Lipase     Component Value Date/Time   LIPASE 19 03/02/2017 1601   Studies/Results: No results  found.  Anti-infectives: Anti-infectives    None       Assessment/Plan Hyponatremia. resolved.  Autism with explosive personality disorder-takes multiple psychiatric medications and has sitter at bedside. Ativan IV as needed. Hypertension Asthma  Partial SBO versus ileus.  - This is resolving clinically and radiographically - Tolerating clears  - Continue to avoid narcotics - continue daily dulcolax - Ambulate  FEN: fulls VTE: lovenox ID: none  DISPO: appears that his SBO is resolving. Advance to SOFT diet. Stable for discharge from surgical perspective. Discharge info provided    LOS: 4 days    Adam PhenixElizabeth S Tycen Dockter , [facility] 03/06/2017, 8:50 AM Pager: [phone] Consults: [phone] Mon-Fri 7:00 am-4:30 pm Sat-Sun 7:00 am-11:30 am

## 2017-03-06 NOTE — Clinical Social Work Note (Signed)
Clinical Social Work Assessment  Patient Details  Name: Ralph Black MRN: [id number] Date of Birth: 10/14/1975  Date of referral:  03/06/17               Reason for consult:  Discharge Planning                Permission sought to share information with:  Facility Sport and exercise psychologist Permission granted to share information::     Name::        Agency::  [facility]  Relationship::     Contact Information:  [phone]  Housing/Transportation Living arrangements for the past 2 months:  [place] of Information:  Facility Patient Interpreter Needed:  None Criminal Activity/Legal Involvement Pertinent to Current Situation/Hospitalization:  No - Comment as needed Significant Relationships:  Siblings Consuello Bossier [phone]) Lives with:  Facility Resident Do you feel safe going back to the place where you live?  Yes Need for family participation in patient care:  Yes (Comment)  Care giving concerns:  No care giving concerns identified.    Social Worker assessment / plan:  Pt from [facility]. CSW met with pt who is oriented to self only. CSW spoke with Kalman Shan Orthoptist) at [facility], who confirmed a staff member will be to pick pt up for discharge at [facility] confirmed facility does not need an FL-2, but does need a discharge summary. CSW also left a voicemail with pt sister-Deborah Owens Shark at [phone].   CSW confirmed discharge plan with pt's RN. CSW signing off as no further Social Work needs identified.   Employment status:  Disabled (Comment on whether or not currently receiving Disability) Insurance information:  Medicaid [place] PT Recommendations:  Not assessed at this time Information / Referral to community resources:  Other (Comment Required) (Return to [facility])  Patient/Family's Response to care:  Pt happy and watching Spongebob in chair, with safety sitter present.   Patient/Family's Understanding of and Emotional  Response to Diagnosis, Current Treatment, and Prognosis:  Pt developmentally delayed and resident at an [employer].   Emotional Assessment Appearance:  Appears stated age Attitude/Demeanor/Rapport:  Other (Appropriate) Affect (typically observed):  Happy Orientation:  Oriented to Self Alcohol / Substance use:  Other Psych involvement (Current and /or in the community):  No (Comment)  Discharge Needs  Concerns to be addressed:  Care Coordination Readmission within the last 30 days:  No Current discharge risk:  Cognitively Impaired, Physical Impairment, Dependent with Mobility Barriers to Discharge:  Continued Medical Work up   Truitt Merle, LCSW 03/06/2017, 6:25 PM

## 2017-03-06 NOTE — Discharge Summary (Signed)
Physician Discharge Summary  Ralph Black ZOX:[id number] DOB: 08-28-1976 DOA: 03/02/2017  PCP: Patient, No Pcp Per  Admit date: 03/02/2017 Discharge date: 03/06/2017  Admitted From:group home Disposition:group home  Recommendations for Outpatient Follow-up:  1. Follow up with PCP in 1-2 weeks 2. Please obtain BMP/CBC in one week  Home Health:no Equipment/Devices:no Discharge Condition:stable CODE STATUS:full Diet recommendation:soft/heart healthy  Brief/Interim Summary: 40 y.o.malewith medical history significant of severe autism, explosive behavior disorder, asthma presented with abdominal pain, distention and vomiting. In the ER patient was found to have a small bowel obstruction. NG tube was placed.Evaluated by general surgery.  #Small bowel obstruction: -Clinically improved. Able to tolerate diet well. Denied nausea vomiting or abdominal pain. More alert awake and talking. Discussed with the general surgery team. Stable to discharge today. I also discussed with the patient's sister at bedside.  # Hyponatremia likely due to dehydration. Improved. Recommended to follow-up with PCP and monitor labs. Unknown why patient is on desmppression, recommended to discuss with PCP.  #Sinus tachycardia: Resume her medication.  #History of autism, explosive personality disorder: Continue Ativan as needed and resuming home medications slowly. Continue Depakote, added Seroquel. The dose of Zyprexa changed to once a day because of possible risk of QT prolongation. Recommended follow up with psychiatrist.  Clinically improved. Able to tolerate diet. No nausea vomiting or abdominal pain. Medically stable.  Discharge Diagnoses:  Active Problems:   Small bowel obstruction (HCC)   Hyponatremia   Asthma   Explosive personality disorder   Dehydration   Obstipation   Essential hypertension    Discharge Instructions  Discharge Instructions    Call MD for:  difficulty breathing, headache  or visual disturbances    Complete by:  As directed    Call MD for:  extreme fatigue    Complete by:  As directed    Call MD for:  hives    Complete by:  As directed    Call MD for:  persistant dizziness or light-headedness    Complete by:  As directed    Call MD for:  persistant nausea and vomiting    Complete by:  As directed    Call MD for:  severe uncontrolled pain    Complete by:  As directed    Call MD for:  temperature >100.4    Complete by:  As directed    Diet - low sodium heart healthy    Complete by:  As directed    Soft diet   Discharge instructions    Complete by:  As directed    Please follow up with your psychiatrist for medication evaluation. Please follow up with your PCP in 1 week. Please follow up with general surgery as needed.   Increase activity slowly    Complete by:  As directed      Allergies as of 03/06/2017      Reactions   Cephalosporins    Unknown   Penicillins    Has patient had a PCN reaction causing immediate rash, facial/tongue/throat swelling, SOB or lightheadedness with hypotension: No Has patient had a PCN reaction causing severe rash involving mucus membranes or skin necrosis: No Has patient had a PCN reaction that required hospitalization: No Has patient had a PCN reaction occurring within the last 10 years: No If all of the above answers are "NO", then may proceed with Cephalosporin use.      Medication List    TAKE these medications   clonazePAM 0.5 MG tablet Commonly known as:  KLONOPIN Take 0.5 mg by mouth as needed for anxiety.   cloNIDine 0.1 MG tablet Commonly known as:  CATAPRES Take 0.1 mg by mouth 2 (two) times daily.   desmopressin 0.2 MG tablet Commonly known as:  DDAVP Take 0.2 mg by mouth at bedtime.   divalproex 250 MG 24 hr tablet Commonly known as:  DEPAKOTE ER Take 250 mg by mouth 2 (two) times daily.   LINZESS 145 MCG Caps capsule Generic drug:  linaclotide Take 145 mcg by mouth daily before breakfast.    metoprolol tartrate 25 MG tablet Commonly known as:  LOPRESSOR Take 25 mg by mouth daily.   OLANZapine 15 MG tablet Commonly known as:  ZYPREXA Take 1 tablet (15 mg total) by mouth at bedtime. What changed:  when to take this   QUEtiapine 100 MG tablet Commonly known as:  SEROQUEL Take 200 mg by mouth at bedtime.   TOVIAZ 4 MG Tb24 tablet Generic drug:  fesoterodine Take 4 mg by mouth at bedtime.   traZODone 50 MG tablet Commonly known as:  DESYREL Take 50 mg by mouth at bedtime.   zolpidem 10 MG tablet Commonly known as:  AMBIEN Take 10 mg by mouth at bedtime.      Follow-up Information    Violeta Gelinashompson, Burke, MD. Schedule an appointment as soon as possible for a visit in 1 week(s).   Specialty:  General Surgery Why:  as needed Contact information: [address] [place] [address] [phone]          Allergies  Allergen Reactions  . Cephalosporins     Unknown   . Penicillins     Has patient had a PCN reaction causing immediate rash, facial/tongue/throat swelling, SOB or lightheadedness with hypotension: No Has patient had a PCN reaction causing severe rash involving mucus membranes or skin necrosis: No Has patient had a PCN reaction that required hospitalization: No Has patient had a PCN reaction occurring within the last 10 years: No If all of the above answers are "NO", then may proceed with Cephalosporin use.    Consultations: General surgery  Procedures/Studies: NG tube placement  Subjective: Seen and examined at bedside. Sitting on chair comfortable. Denied nausea vomiting and abdomen pain. Excited to go home today. Sister at bedside.  Discharge Exam: Vitals:   03/05/17 1946 03/06/17 0532  BP: 120/76 118/72  Pulse: 96 89  Resp: 17 17  Temp: 97.9 F (36.6 C) 98 F (36.7 C)   Vitals:   03/05/17 0636 03/05/17 1426 03/05/17 1946 03/06/17 0532  BP: 116/73 124/78 120/76 118/72  Pulse: 97 93 96 89  Resp: 18 18 17 17   Temp: 97.7  F (36.5 C) 98 F (36.7 C) 97.9 F (36.6 C) 98 F (36.7 C)  TempSrc: Axillary Oral Oral Oral  SpO2: 100% 100% 100% 99%  Weight:      Height:        General: Pt is alert, awake, not in acute distress Cardiovascular: RRR, S1/S2 +, no rubs, no gallops Respiratory: CTA bilaterally, no wheezing, no rhonchi Abdominal: Soft, NT, ND, bowel sounds + Extremities: no edema, no cyanosis    The results of significant diagnostics from this hospitalization (including imaging, microbiology, ancillary and laboratory) are listed below for reference.     Microbiology: No results found for this or any previous visit (from the past 240 hour(s)).   Labs: BNP (last 3 results) No results for input(s): BNP in the last 8760 hours. Basic Metabolic Panel:  Recent  Labs Lab 03/02/17 1601 03/03/17 0326 03/04/17 0417 03/04/17 1815 03/05/17 0518  NA 122* 126* 141 136 140  K 3.7 3.8 4.2 4.0 3.9  CL 90* 93* 110 106 108  CO2 22 24 24 22 25   GLUCOSE 117* 124* 123* 133* 95  BUN 10 8 <5* <5* <5*  CREATININE 0.39* 0.42* 0.42* 0.43* 0.33*  CALCIUM 9.0 8.1* 8.3* 8.5* 8.8*  MG  --  1.4* 2.3  --   --   PHOS  --  2.6  --   --   --    Liver Function Tests:  Recent Labs Lab 03/02/17 1601 03/03/17 0326  AST 27 25  ALT 12* 11*  ALKPHOS 45 43  BILITOT 0.7 0.8  PROT 7.1 5.7*  ALBUMIN 3.7 3.2*    Recent Labs Lab 03/02/17 1601  LIPASE 19   No results for input(s): AMMONIA in the last 168 hours. CBC:  Recent Labs Lab 03/02/17 1601 03/03/17 0326 03/04/17 0417  WBC 10.8* 9.6 8.4  HGB 12.1* 11.1* 11.3*  HCT 35.4* 32.9* 35.2*  MCV 85.3 86.8 89.1  PLT 132* 176 171   Cardiac Enzymes: No results for input(s): CKTOTAL, CKMB, CKMBINDEX, TROPONINI in the last 168 hours. BNP: Invalid input(s): POCBNP CBG:  Recent Labs Lab 03/05/17 1154 03/05/17 1944 03/05/17 2315 03/06/17 0345 03/06/17 0752  GLUCAP 120* 119* 91 96 116*   D-Dimer No results for input(s): DDIMER in the last 72  hours. Hgb A1c No results for input(s): HGBA1C in the last 72 hours. Lipid Profile No results for input(s): CHOL, HDL, LDLCALC, TRIG, CHOLHDL, LDLDIRECT in the last 72 hours. Thyroid function studies No results for input(s): TSH, T4TOTAL, T3FREE, THYROIDAB in the last 72 hours.  Invalid input(s): FREET3 Anemia work up No results for input(s): VITAMINB12, FOLATE, FERRITIN, TIBC, IRON, RETICCTPCT in the last 72 hours. Urinalysis    Component Value Date/Time   COLORURINE YELLOW 03/03/2017 0342   APPEARANCEUR CLEAR 03/03/2017 0342   LABSPEC 1.036 (H) 03/03/2017 0342   PHURINE 5.0 03/03/2017 0342   GLUCOSEU NEGATIVE 03/03/2017 0342   HGBUR NEGATIVE 03/03/2017 0342   BILIRUBINUR NEGATIVE 03/03/2017 0342   KETONESUR 5 (A) 03/03/2017 0342   PROTEINUR NEGATIVE 03/03/2017 0342   NITRITE NEGATIVE 03/03/2017 0342   LEUKOCYTESUR NEGATIVE 03/03/2017 0342   Sepsis Labs Invalid input(s): PROCALCITONIN,  WBC,  LACTICIDVEN Microbiology No results found for this or any previous visit (from the past 240 hour(s)).   Time coordinating discharge: 28 minutes  SIGNED:   Maxie Barb, MD  [facility] 03/06/2017, 10:43 AM  If 7PM-7AM, please contact night-coverage [email or web address] Password [email or web address]

## 2017-03-06 NOTE — Discharge Instructions (Signed)
CONTINUE A LOW FIBER DIET FOR A FEW DAYS TO ONE WEEK AFTER DISCHARGE AND THEN GRADUALLY TRANSITION TO A HIGH FIBER DIET. SEE BELOW.  Low-Fiber Diet Fiber is found in fruits, vegetables, and whole grains. A low-fiber diet restricts fibrous foods that are not digested in the small intestine. A diet containing about 10-15 grams of fiber per day is considered low fiber. Low-fiber diets may be used to:  Promote healing and rest the bowel during intestinal flare-ups.  Prevent blockage of a partially obstructed or narrowed gastrointestinal tract.  Reduce fecal weight and volume.  Slow the movement of feces.  You may be on a low-fiber diet as a transitional diet following surgery, after an injury (trauma), or because of a short (acute) or lifelong (chronic) illness. Your health care provider will determine the length of time you need to stay on this diet. What do I need to know about a low-fiber diet? Always check the fiber content on the packaging's Nutrition Facts label, especially on foods from the grains list. Ask your dietitian if you have questions about specific foods that are related to your condition, especially if the food is not listed below. In general, a low-fiber food will have less than 2 g of fiber. What foods can I eat? Grains All breads and crackers made with white flour. Sweet rolls, doughnuts, waffles, pancakes, [place] toast, bagels. Pretzels, Melba toast, zwieback. Well-cooked cereals, such as cornmeal, farina, or cream cereals. Dry cereals that do not contain whole grains, fruit, or nuts, such as refined corn, wheat, rice, and oat cereals. Potatoes prepared any way without skins, plain pastas and noodles, refined white rice. Use white flour for baking and making sauces. Use allowed list of grains for casseroles, dumplings, and puddings. Vegetables Strained tomato and vegetable juices. Fresh lettuce, cucumber, spinach. Well-cooked (no skin or pulp) or canned vegetables, such as  asparagus, bean sprouts, beets, carrots, green beans, mushrooms, potatoes, pumpkin, spinach, yellow squash, tomato sauce/puree, turnips, yams, and zucchini. Keep servings limited to  cup. Fruits All fruit juices except prune juice. Cooked or canned fruits without skin and seeds, such as applesauce, apricots, cherries, fruit cocktail, grapefruit, grapes, mandarin oranges, melons, peaches, pears, pineapple, and plums. Fresh fruits without skin, such as apricots, avocados, bananas, melons, pineapple, nectarines, and peaches. Keep servings limited to  cup or 1 piece. Meat and Other Protein Sources Ground or well-cooked tender beef, ham, veal, lamb, pork, or poultry. Eggs, plain cheese. Fish, oysters, shrimp, lobster, and other seafood. Liver, organ meats. Smooth nut butters. Dairy All milk products and alternative dairy substitutes, such as soy, rice, almond, and coconut, not containing added whole nuts, seeds, or added fruit. Beverages Decaf coffee, fruit, and vegetable juices or smoothies (small amounts, with no pulp or skins, and with fruits from allowed list), sports drinks, herbal tea. Condiments Ketchup, mustard, vinegar, cream sauce, cheese sauce, cocoa powder. Spices in moderation, such as allspice, basil, bay leaves, celery powder or leaves, cinnamon, cumin powder, curry powder, ginger, mace, marjoram, onion or garlic powder, oregano, paprika, parsley flakes, ground pepper, rosemary, sage, savory, tarragon, thyme, and turmeric. Sweets and Desserts Plain cakes and cookies, pie made with allowed fruit, pudding, custard, cream pie. Gelatin, fruit, ice, sherbet, frozen ice pops. Ice cream, ice milk without nuts. Plain hard candy, honey, jelly, molasses, syrup, sugar, chocolate syrup, gumdrops, marshmallows. Limit overall sugar intake. Fats and Oil Margarine, butter, cream, mayonnaise, salad oils, plain salad dressings made from allowed foods. Choose healthy fats such as olive oil, canola  oil, and  omega-3 fatty acids (such as found in salmon or tuna) when possible. Other Bouillon, broth, or cream soups made from allowed foods. Any strained soup. Casseroles or mixed dishes made with allowed foods. The items listed above may not be a complete list of recommended foods or beverages. Contact your dietitian for more options. What foods are not recommended? Grains All whole wheat and whole grain breads and crackers. Multigrains, rye, bran seeds, nuts, or coconut. Cereals containing whole grains, multigrains, bran, coconut, nuts, raisins. Cooked or dry oatmeal, steel-cut oats. Coarse wheat cereals, granola. Cereals advertised as high fiber. Potato skins. Whole grain pasta, wild or brown rice. Popcorn. Coconut flour. Bran, buckwheat, corn bread, multigrains, rye, wheat germ. Vegetables Fresh, cooked or canned vegetables, such as artichokes, asparagus, beet greens, broccoli, Brussels sprouts, cabbage, celery, cauliflower, corn, eggplant, kale, legumes or beans, okra, peas, and tomatoes. Avoid large servings of any vegetables, especially raw vegetables. Fruits Fresh fruits, such as apples with or without skin, berries, cherries, figs, grapes, grapefruit, guavas, kiwis, mangoes, oranges, papayas, pears, persimmons, pineapple, and pomegranate. Prune juice and juices with pulp, stewed or dried prunes. Dried fruits, dates, raisins. Fruit seeds or skins. Avoid large servings of all fresh fruits. Meats and Other Protein Sources Tough, fibrous meats with gristle. Chunky nut butter. Cheese made with seeds, nuts, or other foods not recommended. Nuts, seeds, legumes (beans, including baked beans), dried peas, beans, lentils. Dairy Yogurt or cheese that contains nuts, seeds, or added fruit. Beverages Fruit juices with high pulp, prune juice. Caffeinated coffee and teas. Condiments Coconut, maple syrup, pickles, olives. Sweets and Desserts Desserts, cookies, or candies that contain nuts or coconut, chunky  peanut butter, dried fruits. Jams, preserves with seeds, marmalade. Large amounts of sugar and sweets. Any other dessert made with fruits from the not recommended list. Other Soups made from vegetables that are not recommended or that contain other foods not recommended. The items listed above may not be a complete list of foods and beverages to avoid. Contact your dietitian for more information. This information is not intended to replace advice given to you by your health care provider. Make sure you discuss any questions you have with your health care provider. Document Released: 03/03/2002 Document Revised: 02/17/2016 Document Reviewed: 08/04/2013 Elsevier Interactive Patient Education  2017 Elsevier Inc.   High-Fiber Diet Fiber, also called dietary fiber, is a type of carbohydrate found in fruits, vegetables, whole grains, and beans. A high-fiber diet can have many health benefits. Your health care provider may recommend a high-fiber diet to help:  Prevent constipation. Fiber can make your bowel movements more regular.  Lower your cholesterol.  Relieve hemorrhoids, uncomplicated diverticulosis, or irritable bowel syndrome.  Prevent overeating as part of a weight-loss plan.  Prevent heart disease, type 2 diabetes, and certain cancers.  What is my plan? The recommended daily intake of fiber includes:  38 grams for men under age 42.  30 grams for men over age 42.  25 grams for women under age 42.  21 grams for women over age 42.  You can get the recommended daily intake of dietary fiber by eating a variety of fruits, vegetables, grains, and beans. Your health care provider may also recommend a fiber supplement if it is not possible to get enough fiber through your diet. What do I need to know about a high-fiber diet?  Fiber supplements have not been widely studied for their effectiveness, so it is better to get fiber through food sources.  Always check the fiber content on  thenutrition facts label of any prepackaged food. Look for foods that contain at least 5 grams of fiber per serving.  Ask your dietitian if you have questions about specific foods that are related to your condition, especially if those foods are not listed in the following section.  Increase your daily fiber consumption gradually. Increasing your intake of dietary fiber too quickly may cause bloating, cramping, or gas.  Drink plenty of water. Water helps you to digest fiber. What foods can I eat? Grains Whole-grain breads. Multigrain cereal. Oats and oatmeal. Brown rice. Barley. Bulgur wheat. Millet. Bran muffins. Popcorn. Rye wafer crackers. Vegetables Sweet potatoes. Spinach. Kale. Artichokes. Cabbage. Broccoli. Green peas. Carrots. Squash. Fruits Berries. Pears. Apples. Oranges. Avocados. Prunes and raisins. Dried figs. Meats and Other Protein Sources Navy, kidney, pinto, and soy beans. Split peas. Lentils. Nuts and seeds. Dairy Fiber-fortified yogurt. Beverages Fiber-fortified soy milk. Fiber-fortified orange juice. Other Fiber bars. The items listed above may not be a complete list of recommended foods or beverages. Contact your dietitian for more options. What foods are not recommended? Grains White bread. Pasta made with refined flour. White rice. Vegetables Fried potatoes. Canned vegetables. Well-cooked vegetables. Fruits Fruit juice. Cooked, strained fruit. Meats and Other Protein Sources Fatty cuts of meat. Fried Environmental education officer or fried fish. Dairy Milk. Yogurt. Cream cheese. Sour cream. Beverages Soft drinks. Other Cakes and pastries. Butter and oils. The items listed above may not be a complete list of foods and beverages to avoid. Contact your dietitian for more information. What are some tips for including high-fiber foods in my diet?  Eat a wide variety of high-fiber foods.  Make sure that half of all grains consumed each day are whole grains.  Replace breads and  cereals made from refined flour or white flour with whole-grain breads and cereals.  Replace white rice with brown rice, bulgur wheat, or millet.  Start the day with a breakfast that is high in fiber, such as a cereal that contains at least 5 grams of fiber per serving.  Use beans in place of meat in soups, salads, or pasta.  Eat high-fiber snacks, such as berries, raw vegetables, nuts, or popcorn. This information is not intended to replace advice given to you by your health care provider. Make sure you discuss any questions you have with your health care provider. Document Released: 09/11/2005 Document Revised: 02/17/2016 Document Reviewed: 02/24/2014 Elsevier Interactive Patient Education  2017 [employer].

## 2018-06-02 IMAGING — CT CT ABD-PELV W/ CM
2 of 5 series · 15 of 46 positions shown, 17 images · IV contrast (Omni 300)
Comparison: Acute abdominal series from the same day.

CLINICAL DATA: Abdominal pain and distention.

EXAM:
CT ABDOMEN AND PELVIS WITH CONTRAST
TECHNIQUE: Multidetector CT imaging of the abdomen and pelvis was performed
using the standard protocol following bolus administration of
intravenous contrast.
CONTRAST:  <See Chart> EIAUP7-RDD IOPAMIDOL (EIAUP7-RDD) INJECTION
61%

[Series 3: a/p w/ 5mm · axial · 0.64mm/px · z∈[+847,+1267]mm · 12 of 98 slices shown, 14 images]
[im 7/98  soft-tissue]
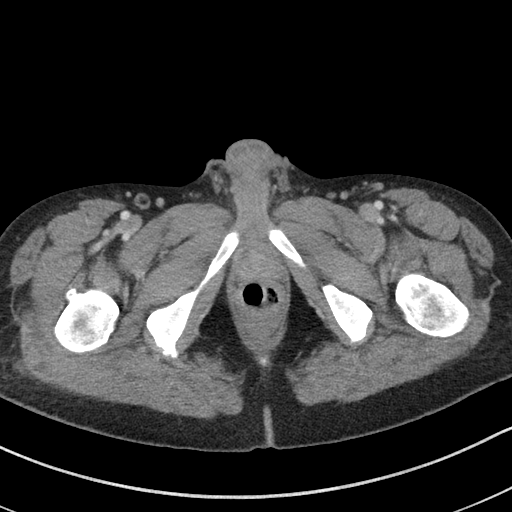
[im 7/98  bone]
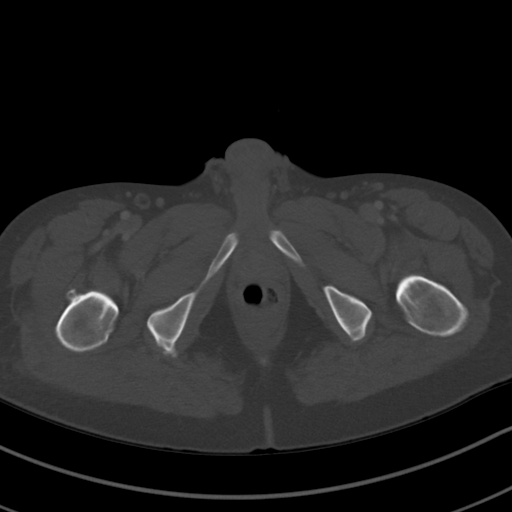
[im 13/98  soft-tissue]
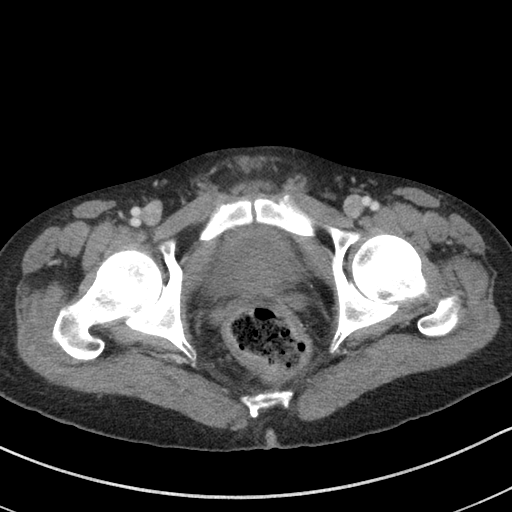
[im 20/98  soft-tissue]
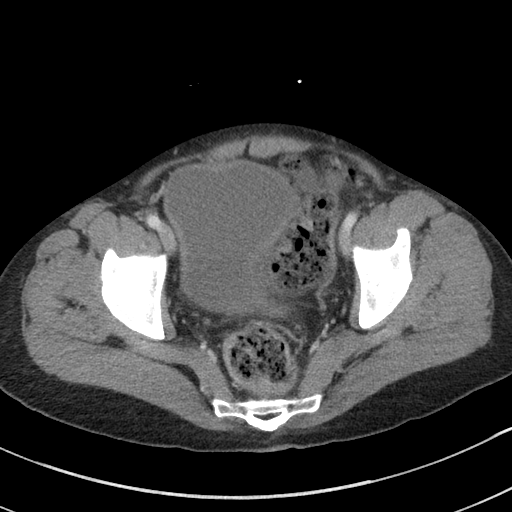
[im 33/98  soft-tissue]
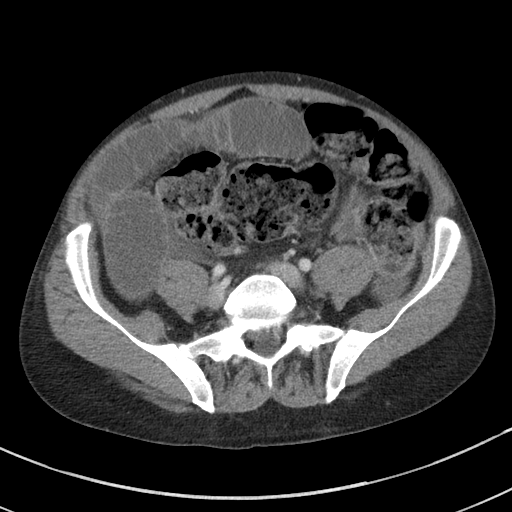
[im 39/98  soft-tissue]
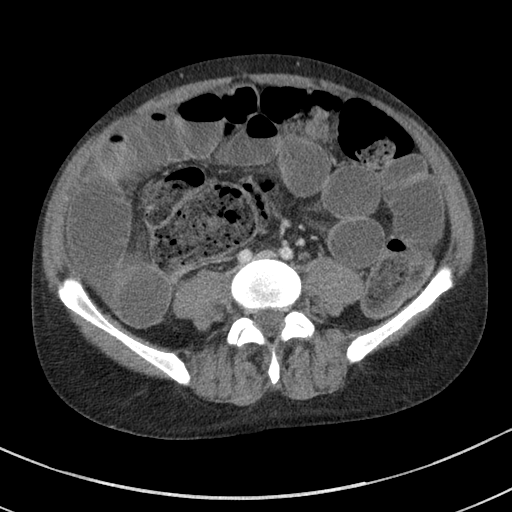
[im 46/98  soft-tissue]
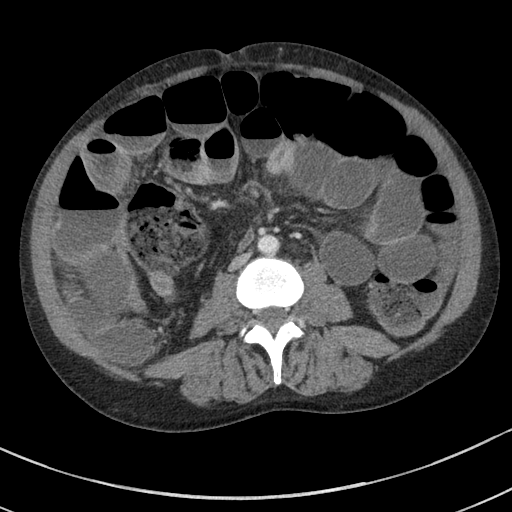
[im 52/98  soft-tissue]
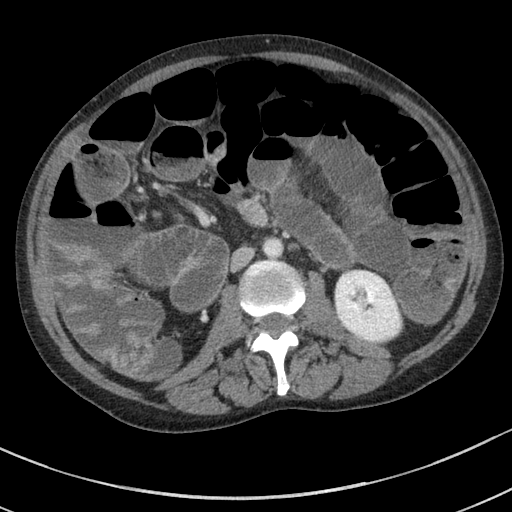
[im 59/98  soft-tissue]
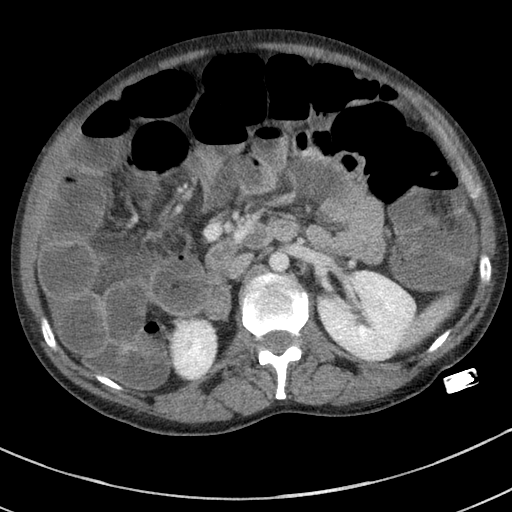
[im 65/98  soft-tissue]
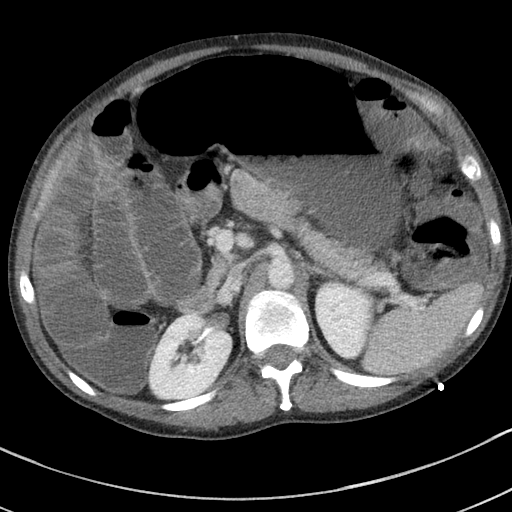
[im 65/98  bone]
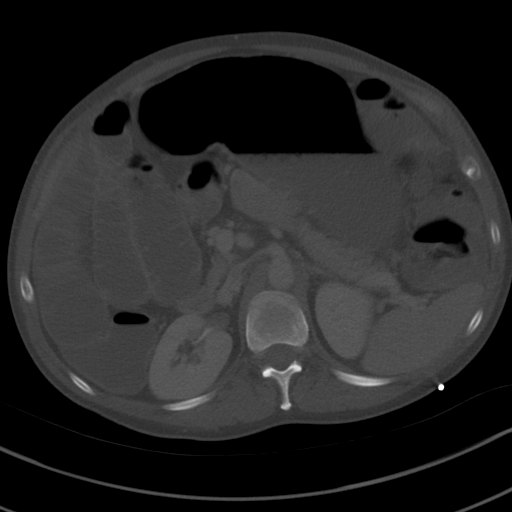
[im 78/98  soft-tissue]
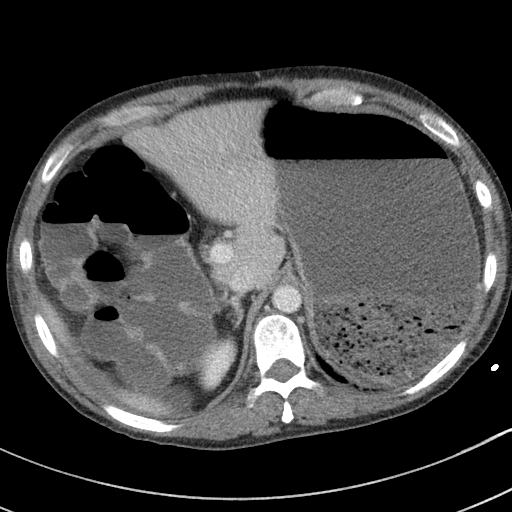
[im 85/98  soft-tissue]
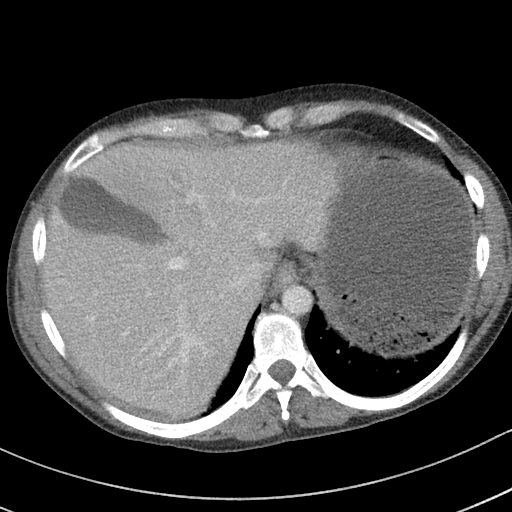
[im 91/98  soft-tissue]
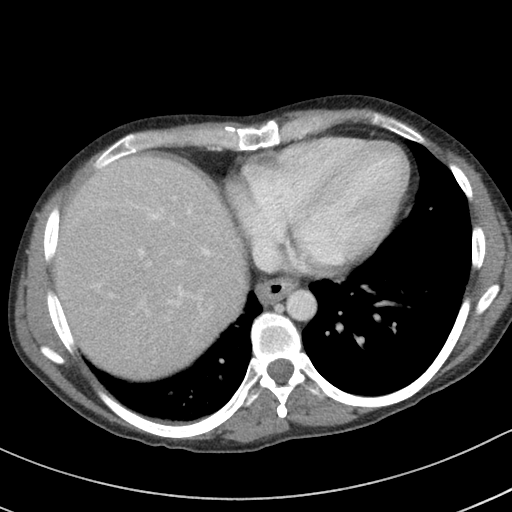

[Series 6: a/p w/ cor · coronal · 0.64mm/px · 3 of 126 slices shown]
[im 42/126  soft-tissue]
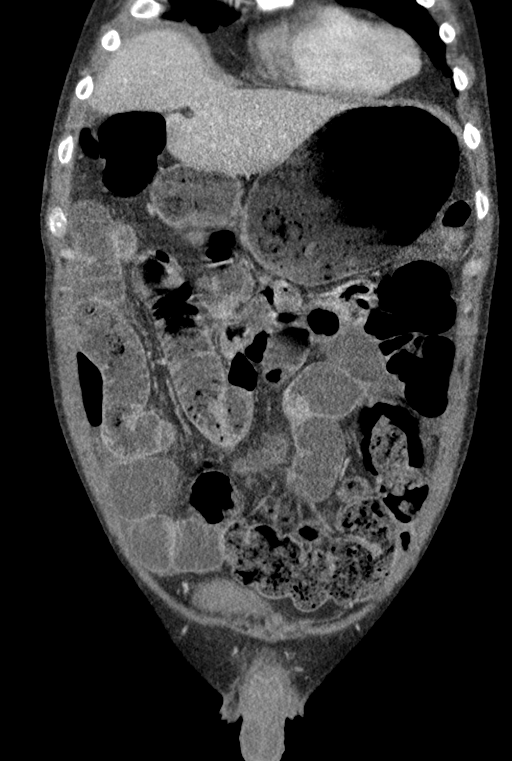
[im 56/126  soft-tissue]
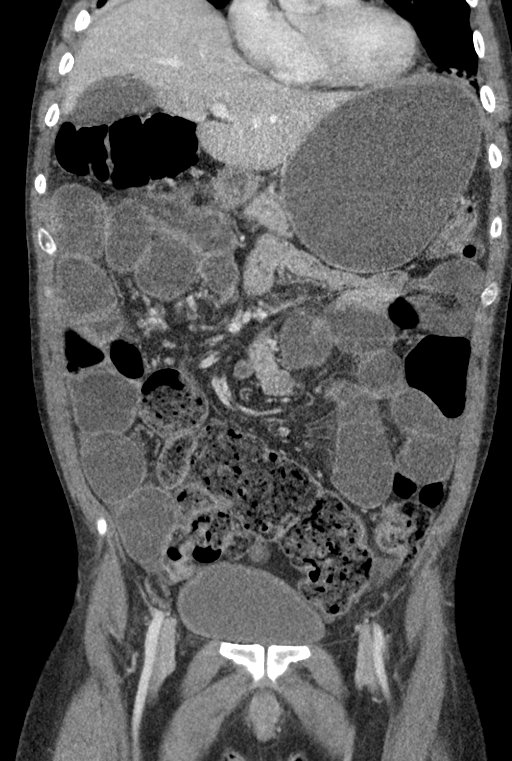
[im 70/126  soft-tissue]
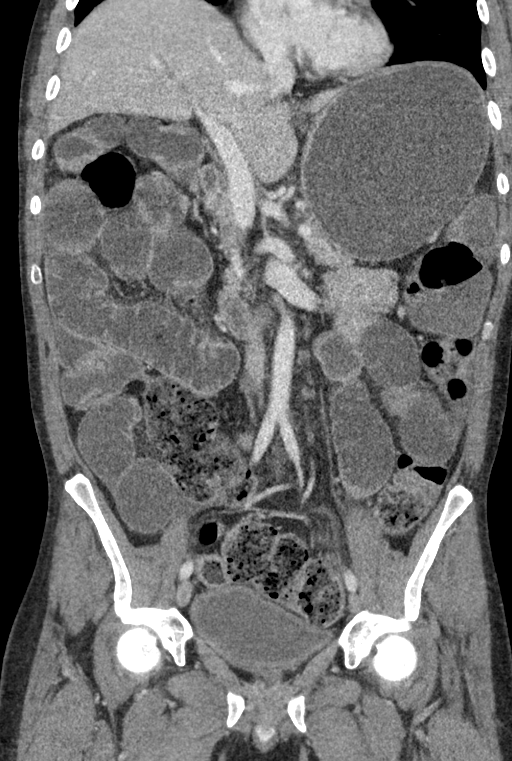

[15 of 46 positions shown; findings below may reference images not displayed]

FINDINGS: Lower chest: Bilateral lower lobe atelectasis present. The heart
size is normal. No significant pleural or pericardial effusion is
present.

Hepatobiliary: No focal hepatic lesions are present. The common bile
duct and gallbladder are within normal limits.

Pancreas: Unremarkable. No pancreatic ductal dilatation or
surrounding inflammatory changes.

Spleen: Normal in size without focal abnormality.

Adrenals/Urinary Tract: The adrenal glands are within normal limits
bilaterally. The kidneys and ureters are unremarkable. The urinary
bladder is mostly collapsed.

Stomach/Bowel: The stomach is markedly distended. A fluid level is
present. The second and third portions of the duodenum are not
distended. Very proximal small bowel is not distended. The remainder
the small bowel is distended with multiple fluid levels. Loops of
small bowel measure up to 3.6 cm. Very distal terminal ileum is
collapsed. There is a rotated segment at the base of the sigmoid
colon. This could represent an internal hernia. There is fluid in
the proximal colon. Stool is present of the sigmoid colon. The
sigmoid colon extends superiorly and to the right without a definite
volvulus. The more distal sigmoid colon is less distended. The
rectum is unremarkable.

Vascular/Lymphatic: No significant vascular findings are present. No
enlarged abdominal or pelvic lymph nodes.

Reproductive: Prostate is unremarkable.

Other: No abdominal wall hernia or abnormality. No abdominopelvic
ascites.

Musculoskeletal: No focal lytic or blastic lesions are present.
Vertebral body heights and alignment are maintained.
IMPRESSION: 1. Distal small bowel obstruction with multiple dilated loops of
small bowel and fluid levels.
2. The very distal terminal ileum is collapsed. The obstruction
appears to be at the base of the mesentery. Internal hernia is not
excluded.
3. Moderate stool is present within the descending and sigmoid
colon. A relatively long segment of sigmoid colon is noted. There is
no definite volvulus.

## 2018-06-04 IMAGING — DX DG ABDOMEN 2V
3 series · 3 of 3 positions shown · non-contrast
Comparison: 03/03/2017 and prior exams

CLINICAL DATA: Small bowel obstruction.

EXAM:
ABDOMEN - 2 VIEW

[x abdomen supine (1 of 3)]
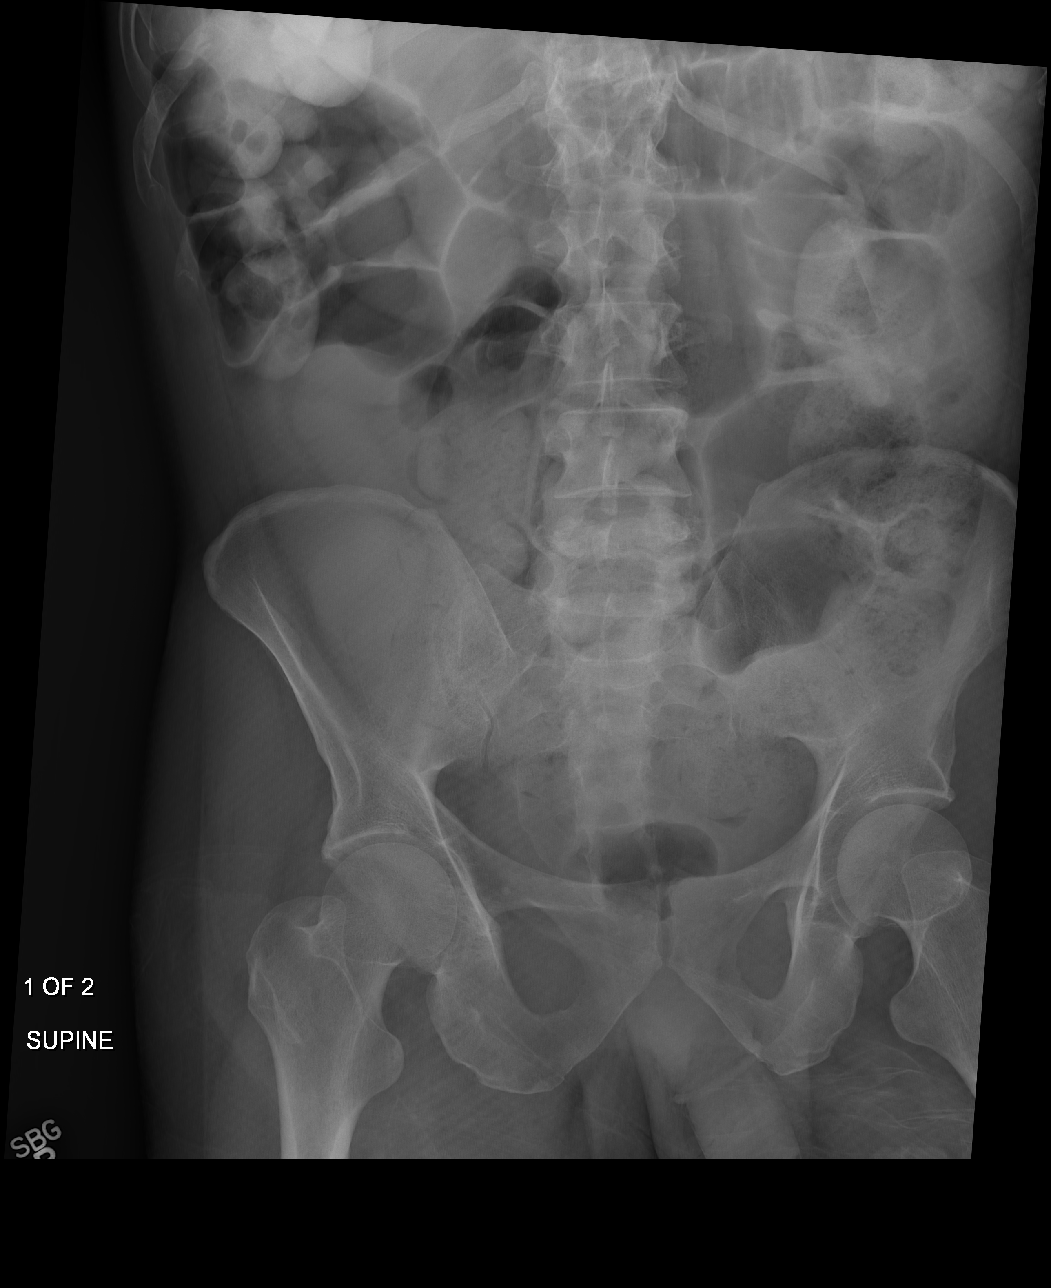

[x abdomen supine (2 of 3)]
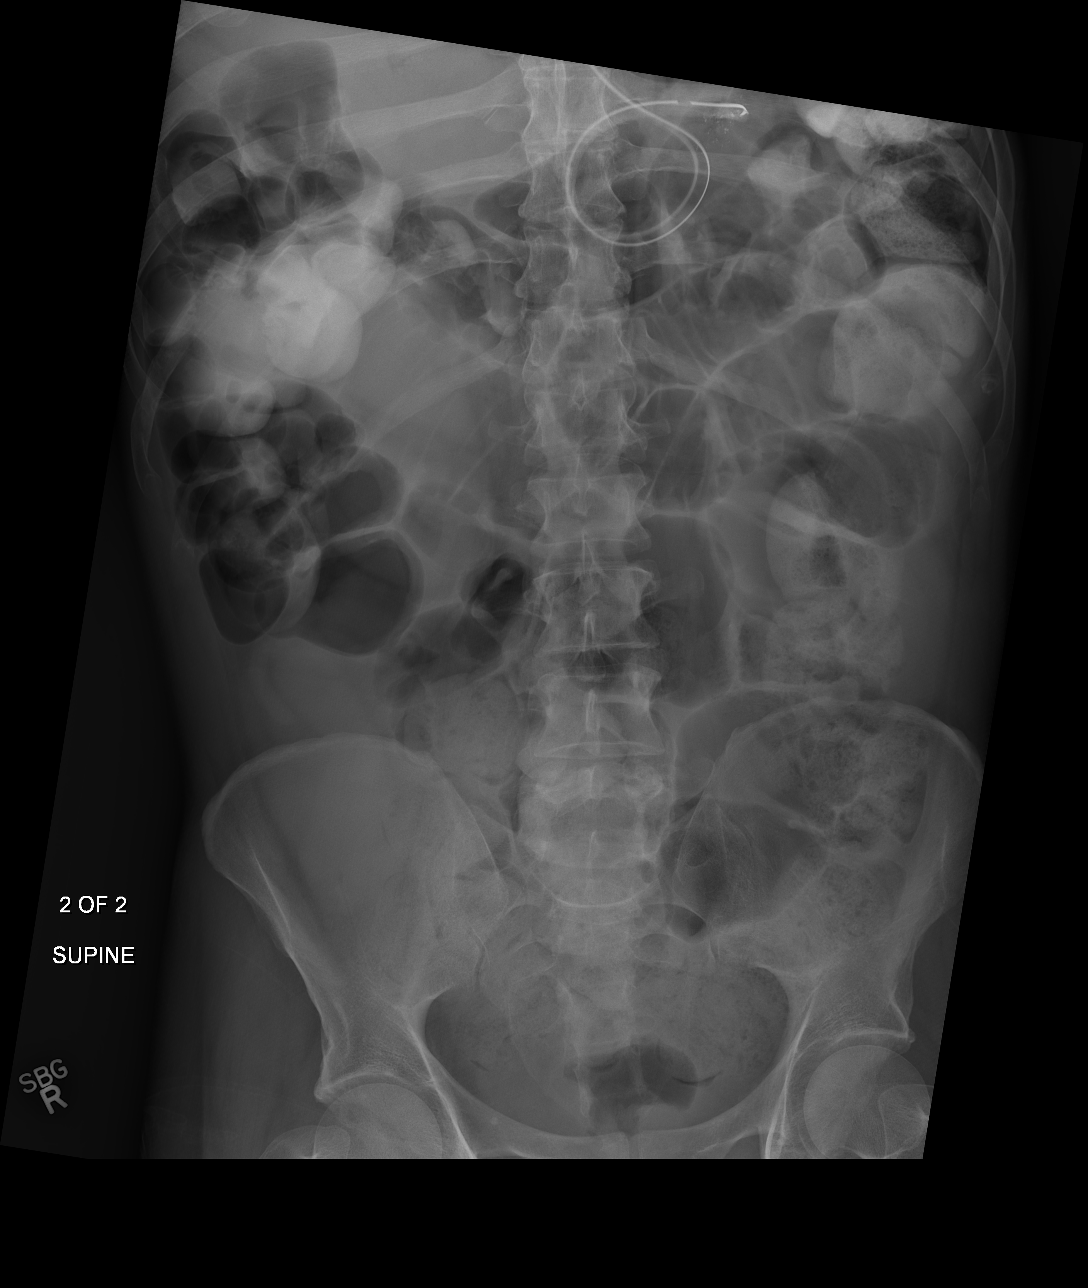

[x abdomen supine (3 of 3)]
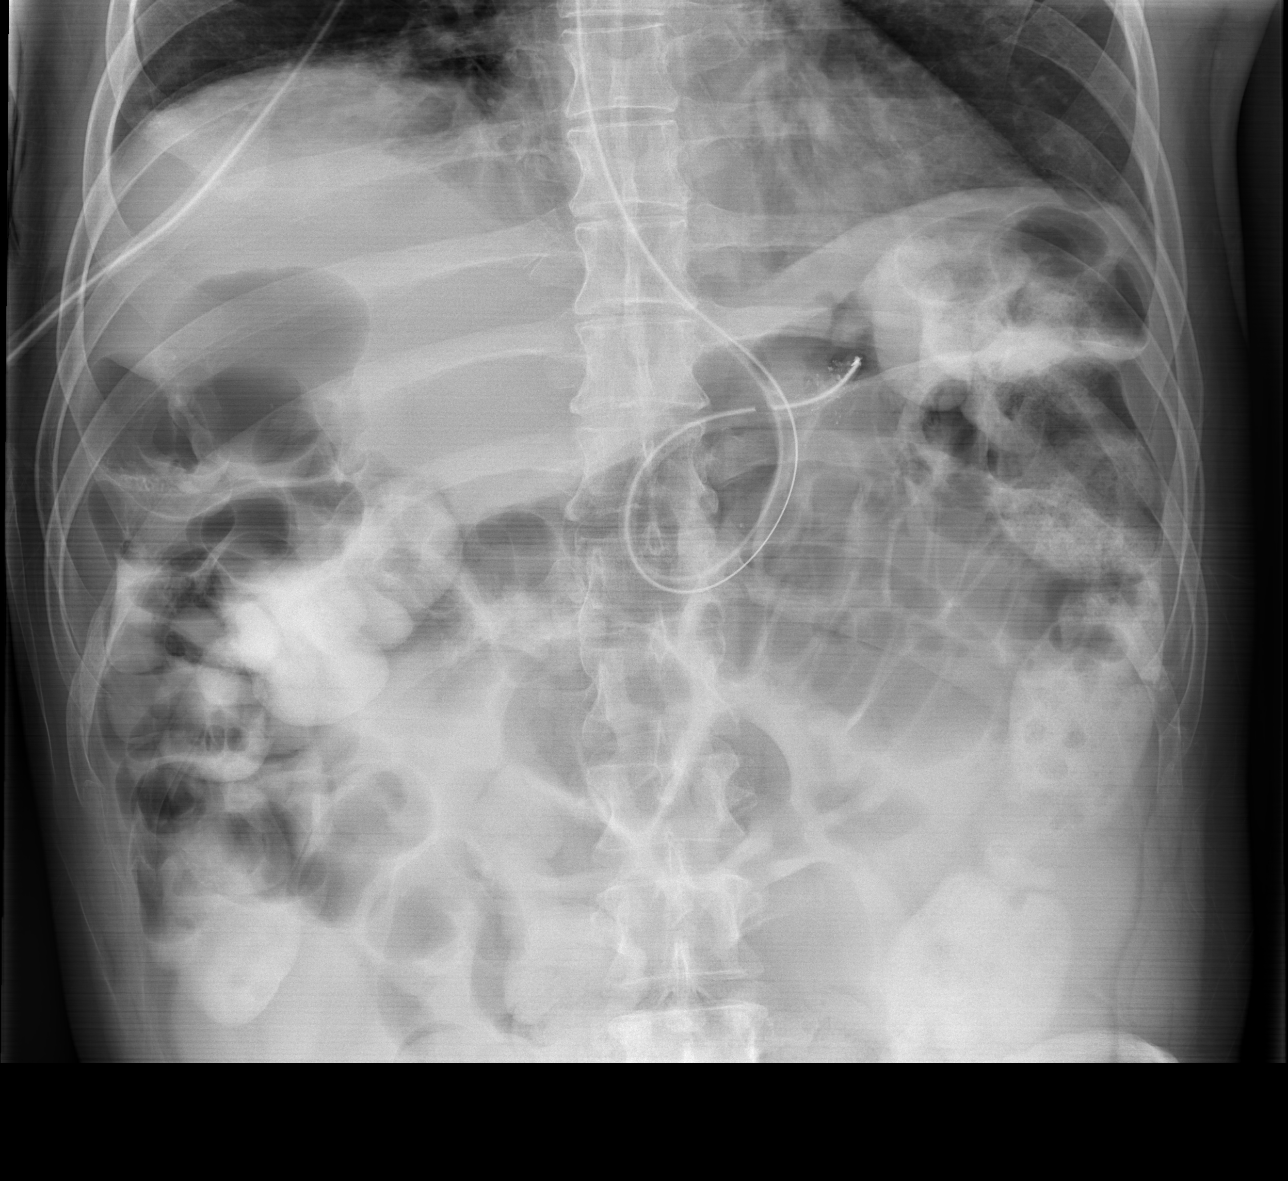

[3 of 3 positions shown; findings below may reference images not displayed]

FINDINGS: Enteric contrast is now noted within the colon.

An NG tube is present with tip overlying the proximal stomach.

Distended small bowel loops are again identified.

No other changes noted.
IMPRESSION: Enteric contrast has progressed into the colon since 03/03/2017
suggesting resolution of or partial small bowel obstruction.

## 2020-01-05 ENCOUNTER — Ambulatory Visit: Payer: Medicare Other | Attending: Internal Medicine

## 2022-06-05 ENCOUNTER — Other Ambulatory Visit: Payer: Self-pay | Admitting: Internal Medicine

## 2022-06-06 LAB — BASIC METABOLIC PANEL WITH GFR
BUN/Creatinine Ratio: 32 (calc) — ABNORMAL HIGH (ref 6–22)
BUN: 15 mg/dL (ref 7–25)
CO2: 24 mmol/L (ref 20–32)
Calcium: 9.6 mg/dL (ref 8.6–10.3)
Chloride: 93 mmol/L — ABNORMAL LOW (ref 98–110)
Creat: 0.47 mg/dL — ABNORMAL LOW (ref 0.60–1.29)
Glucose, Bld: 74 mg/dL (ref 65–99)
Potassium: 4.1 mmol/L (ref 3.5–5.3)
Sodium: 130 mmol/L — ABNORMAL LOW (ref 135–146)
eGFR: 129 mL/min/{1.73_m2} (ref >=60)

## 2022-06-06 LAB — CBC
HCT: 37.4 % — ABNORMAL LOW (ref 38.5–50.0)
Hemoglobin: 13 g/dL — ABNORMAL LOW (ref 13.2–17.1)
MCH: 29.2 pg (ref 27.0–33.0)
MCHC: 34.8 g/dL (ref 32.0–36.0)
MCV: 84 fL (ref 80.0–100.0)
MPV: 10 fL (ref 7.5–12.5)
Platelets: 181 10*3/uL (ref 140–400)
RBC: 4.45 10*6/uL (ref 4.20–5.80)
RDW: 13.4 % (ref 11.0–15.0)
WBC: 5.7 10*3/uL (ref 3.8–10.8)

## 2023-04-23 ENCOUNTER — Other Ambulatory Visit: Payer: Self-pay | Admitting: Internal Medicine

## 2023-05-20 ENCOUNTER — Emergency Department (HOSPITAL_COMMUNITY): Payer: Medicare Other

## 2023-05-20 ENCOUNTER — Emergency Department (HOSPITAL_BASED_OUTPATIENT_CLINIC_OR_DEPARTMENT_OTHER): Payer: Medicare Other

## 2023-05-20 ENCOUNTER — Other Ambulatory Visit: Payer: Self-pay

## 2023-05-20 ENCOUNTER — Emergency Department (HOSPITAL_COMMUNITY)
Admission: EM | Admit: 2023-05-20 | Discharge: 2023-05-20 | Disposition: A | Discharge status: Home / Self Care | Payer: Medicare Other | Source: Home / Self Care | Attending: Emergency Medicine | Admitting: Emergency Medicine

## 2023-05-20 DIAGNOSIS — S0990XA Unspecified injury of head, initial encounter: Secondary | ICD-10-CM

## 2023-05-20 DIAGNOSIS — R451 Restlessness and agitation: Secondary | ICD-10-CM | POA: Insufficient documentation

## 2023-05-20 DIAGNOSIS — Y9339 Activity, other involving climbing, rappelling and jumping off: Secondary | ICD-10-CM | POA: Insufficient documentation

## 2023-05-20 DIAGNOSIS — F840 Autistic disorder: Secondary | ICD-10-CM | POA: Insufficient documentation

## 2023-05-20 DIAGNOSIS — M7989 Other specified soft tissue disorders: Secondary | ICD-10-CM

## 2023-05-20 DIAGNOSIS — W06XXXA Fall from bed, initial encounter: Secondary | ICD-10-CM | POA: Insufficient documentation

## 2023-05-20 DIAGNOSIS — Z23 Encounter for immunization: Secondary | ICD-10-CM | POA: Insufficient documentation

## 2023-05-20 DIAGNOSIS — S0101XA Laceration without foreign body of scalp, initial encounter: Secondary | ICD-10-CM | POA: Diagnosis not present

## 2023-05-20 MED ORDER — BACITRACIN ZINC 500 UNIT/GM EX OINT: TOPICAL_OINTMENT | Freq: Two times a day (BID) | CUTANEOUS | Status: DC

## 2023-05-20 MED ORDER — LIDOCAINE HCL (PF) 1 % IJ SOLN
INTRAMUSCULAR | Status: AC
  Administered 2023-05-20: 10 mg
  Filled 2023-05-20: qty 30

## 2023-05-20 MED ORDER — LIDOCAINE HCL (PF) 1 % IJ SOLN
20.0000 mL | Freq: Once | INTRAMUSCULAR | Status: DC
  Filled 2023-05-20: qty 20

## 2023-05-20 MED ORDER — TETANUS-DIPHTH-ACELL PERTUSSIS 5-2.5-18.5 LF-MCG/0.5 IM SUSY
0.5000 mL | PREFILLED_SYRINGE | Freq: Once | INTRAMUSCULAR | Status: AC
  Administered 2023-05-20: 0.5 mL via INTRAMUSCULAR
  Filled 2023-05-20: qty 0.5

## 2023-05-20 MED ORDER — MIDAZOLAM HCL 2 MG/ML PO SYRP
2.0000 mg | ORAL_SOLUTION | Freq: Once | ORAL | Status: AC
  Administered 2023-05-20: 2 mg via ORAL
  Filled 2023-05-20: qty 5

## 2023-05-20 MED ORDER — LIDOCAINE-EPINEPHRINE-TETRACAINE (LET) TOPICAL GEL
3.0000 mL | Freq: Once | TOPICAL | Status: AC
  Administered 2023-05-20: 3 mL via TOPICAL
  Filled 2023-05-20: qty 3

## 2023-05-20 MED ORDER — OXYCODONE-ACETAMINOPHEN 5-325 MG PO TABS
1.0000 | ORAL_TABLET | Freq: Once | ORAL | Status: AC
  Administered 2023-05-20: 1 via ORAL
  Filled 2023-05-20: qty 1

## 2023-05-20 NOTE — ED Provider Notes (Signed)
[place] EMERGENCY DEPARTMENT AT [facility] Provider Note   CSN: [id number] Arrival date & time: 05/20/23  2130     History  Chief Complaint  Patient presents with   Fall   HPI Ralph Black is a 47 y.o. male with autism and intellectual disability presenting for fall. Per his caregiver, patient was agitated, jumping on the bed fell and hit his head on the back of the wooden headboard.  Denies loss of consciousness.  Denies use of blood thinners.  Bleeding controlled with compression.  Also applied some antibiotic cream.  States that patient remains agitated but otherwise returned to his normal baseline level of mentation.  Reports a 3 inch laceration to the back of his head.  Caretaker also mention that he has had some swelling in the left leg that has been there for couple months.  Unsure of last tetanus shot.   Fall       Home Medications Prior to Admission medications   Medication Sig Start Date End Date Taking? Authorizing Provider  clonazePAM (KLONOPIN) 0.5 MG tablet Take 0.5 mg by mouth as needed for anxiety.    [provider]  cloNIDine (CATAPRES) 0.1 MG tablet Take 0.1 mg by mouth 2 (two) times daily.    [provider]  desmopressin (DDAVP) 0.2 MG tablet Take 0.2 mg by mouth at bedtime.     [provider]  divalproex (DEPAKOTE ER) 250 MG 24 hr tablet Take 250 mg by mouth 2 (two) times daily.     [provider]  fesoterodine (TOVIAZ) 4 MG TB24 tablet Take 4 mg by mouth at bedtime.    [provider]  linaclotide (LINZESS) 145 MCG CAPS capsule Take 145 mcg by mouth daily before breakfast.    [provider]  metoprolol tartrate (LOPRESSOR) 25 MG tablet Take 25 mg by mouth daily.    [provider]  OLANZapine (ZYPREXA) 15 MG tablet Take 1 tablet (15 mg total) by mouth at bedtime. 03/06/17   Maxie Barb, MD  QUEtiapine (SEROQUEL) 100 MG tablet Take 200 mg by mouth at bedtime.     [provider]  traZODone (DESYREL) 50 MG tablet Take 50 mg by mouth at bedtime.    [provider]  zolpidem (AMBIEN) 10 MG tablet Take 10 mg by mouth at bedtime.     [provider]      Allergies    Cephalosporins and Penicillins    Review of Systems   See HPI for pertinent positives   Physical Exam   Vitals:   05/20/23 1027 05/20/23 1345  BP: 99/72 123/81  Pulse: (!) 118 (!) 120  Resp: 18 18  Temp: (!) 97 F (36.1 C) (!) 97.5 F (36.4 C)  SpO2: 96% 96%    CONSTITUTIONAL:  well-appearing, NAD NEURO:  Alert and oriented x 3, CN 3-12 grossly intact, non verbal. Able to follow simple commands Head: Approximate 6 cm laceration to the posterior scalp.  Not actively bleeding.  No Battle sign, raccoon eyes or rhinorrhea noted.  EYES:  eyes equal and reactive ENT/NECK:  Supple, no stridor  CARDIO: appears well-perfused  PULM:  No respiratory distress, CTAB GI/GU:  non-distended, soft MSK/SPINE:  No gross deformities, no edema, moves all extremities, left lower extremity edema noted, nonpitting, 1+. SKIN:  no rash, atraumatic  *Additional and/or pertinent findings included in MDM below  ED Results / Procedures / Treatments   Labs (all labs ordered are listed, but only abnormal  results are displayed) Labs Reviewed - No data to display  EKG None  Radiology VAS [place] LOWER EXTREMITY VENOUS (DVT) (7a-7p)  Result Date: 05/20/2023  Lower Venous DVT Study Patient Name:  Ralph Black  Date of Exam:   05/20/2023 Medical Rec #: [id number]       Accession #:    [id number] Date of Birth: [birth year]       Patient Gender: M Patient Age:   51 years Exam Location:  [facility] Procedure:      VAS [place] LOWER EXTREMITY VENOUS (DVT) Referring Phys: Riki Sheer --------------------------------------------------------------------------------  Indications: Swelling.  Comparison Study: No prior study on file Performing Technologist: Sherren Kerns RVS  Examination  Guidelines: A complete evaluation includes B-mode imaging, spectral Doppler, color Doppler, and power Doppler as needed of all accessible portions of each vessel. Bilateral testing is considered an integral part of a complete examination. Limited examinations for reoccurring indications may be performed as noted. The reflux portion of the exam is performed with the patient in reverse Trendelenburg.  +-----+---------------+---------+-----------+----------+--------------+ RIGHTCompressibilityPhasicitySpontaneityPropertiesThrombus Aging +-----+---------------+---------+-----------+----------+--------------+ CFV  Full           Yes      Yes                                 +-----+---------------+---------+-----------+----------+--------------+   +---------+---------------+---------+-----------+----------+--------------+ LEFT     CompressibilityPhasicitySpontaneityPropertiesThrombus Aging +---------+---------------+---------+-----------+----------+--------------+ CFV      Full           Yes      Yes                                 +---------+---------------+---------+-----------+----------+--------------+ SFJ      Full                                                        +---------+---------------+---------+-----------+----------+--------------+ FV Prox  Full                                                        +---------+---------------+---------+-----------+----------+--------------+ FV Mid   Full                                                        +---------+---------------+---------+-----------+----------+--------------+ FV DistalFull                                                        +---------+---------------+---------+-----------+----------+--------------+ PFV      Full                                                        +---------+---------------+---------+-----------+----------+--------------+  POP      Full           Yes      Yes                                  +---------+---------------+---------+-----------+----------+--------------+ PTV      Full                                                        +---------+---------------+---------+-----------+----------+--------------+ PERO     Full                                                        +---------+---------------+---------+-----------+----------+--------------+     Summary: RIGHT: - No evidence of common femoral vein obstruction.   LEFT: - There is no evidence of deep vein thrombosis in the lower extremity.  - No cystic structure found in the popliteal fossa.  *See table(s) above for measurements and observations.    Preliminary    CT Head Wo Contrast  Result Date: 05/20/2023 CLINICAL DATA:  Provided history: Polytrauma, blunt. Additional history obtained from electronic MEDICAL RECORD NUMBERFall (hitting head). Laceration to back of head. Bleeding. EXAM: CT HEAD WITHOUT CONTRAST TECHNIQUE: Contiguous axial images were obtained from the base of the skull through the vertex without intravenous contrast. RADIATION DOSE REDUCTION: This exam was performed according to the departmental dose-optimization program which includes automated exposure control, adjustment of the mA and/or kV according to patient size and/or use of iterative reconstruction technique. COMPARISON:  None. FINDINGS: Brain: Cerebral volume is normal. There is no acute intracranial hemorrhage. No demarcated cortical infarct. No extra-axial fluid collection. No evidence of an intracranial mass. No midline shift. Vascular: No hyperdense vessel. Skull: No calvarial fracture or aggressive osseous lesion. Sinuses/Orbits: No mass or acute finding within the imaged orbits. Near complete opacification of the right maxillary sinus. Mild mucosal thickening within the left maxillary sinus. 1.6 cm mucous retention cyst or polyp within left sphenoid sinus. Other: Left posterior scalp laceration. A small focus of  polypoid soft tissue extends from right maxillary sinus into the right nasal passage (series 3, image 15). IMPRESSION: 1. No evidence of an acute intracranial abnormality. 2. Left posterior scalp laceration. 3. Paranasal sinus disease, including near complete opacification of the right maxillary sinus. A small focus of polypoid soft tissue extends from the right maxillary sinus into the right nasal passage. ENT referral and direct visualization recommended. Electronically Signed   By: Jackey Loge D.O.   On: 05/20/2023 14:19    Procedures .Marland KitchenLaceration Repair  Date/Time: 05/20/2023 3:03 PM  Performed by: Gareth Eagle, PA-C Authorized by: Gareth Eagle, PA-C   Consent:    Consent obtained:  Verbal   Consent given by:  Patient, guardian and parent   Risks discussed:  Infection, need for additional repair and retained foreign body   Alternatives discussed:  No treatment Universal protocol:    Procedure explained and questions answered to patient or proxy's satisfaction: yes     Relevant documents present and verified: yes     Patient identity confirmed:  Verbally with  patient and arm band Anesthesia:    Anesthesia method:  Topical application   Topical anesthetic:  LET Laceration details:    Location:  Scalp   Length (cm):  7   Depth (mm):  5 Exploration:    Limited defect created (wound extended): no     Hemostasis achieved with:  Direct pressure and LET   Imaging obtained comment:  CT head   Imaging outcome: foreign body not noted     Wound exploration: wound explored through full range of motion     Wound extent: areolar tissue violated     Contaminated: no   Treatment:    Area cleansed with:  Shur-Clens and saline   Amount of cleaning:  Extensive   Irrigation solution:  Sterile saline   Irrigation method:  Pressure wash   Debridement:  None   Undermining:  None Skin repair:    Repair method:  Staples   Number of staples:  9 Approximation:    Approximation:   Close Repair type:    Repair type:  Simple Post-procedure details:    Dressing: dressed with gauze and kerlex.   Procedure completion:  Tolerated well, no immediate complications     Medications Ordered in ED Medications  lidocaine (PF) (XYLOCAINE) 1 % injection 20 mL (has no administration in time range)  Tdap (BOOSTRIX) injection 0.5 mL (has no administration in time range)  lidocaine-EPINEPHrine-tetracaine (LET) topical gel (3 mLs Topical Given 05/20/23 1136)  midazolam (VERSED) 2 MG/ML syrup 2 mg (2 mg Oral Given 05/20/23 1205)  oxyCODONE-acetaminophen (PERCOCET/ROXICET) 5-325 MG per tablet 1 tablet (1 tablet Oral Given 05/20/23 1450)  lidocaine (PF) (XYLOCAINE) 1 % injection (10 mg  Given 05/20/23 1513)    ED Course/ Medical Decision Making/ A&P                                 Medical Decision Making Amount and/or Complexity of Data Reviewed Radiology: ordered.  Risk Prescription drug management.   47 year old well-appearing male presenting for mechanical fall, head injury and scalp laceration.  Exam notable for a moderately sized laceration and small laceration otherwise reassuring.  CT scan of the head without acute abnormalities. DVT study also negative.  Patient was noted to be persistently tachycardic but notably anxious during every reassessment.  Suspect his tachycardia is related to pain and anxiety of being here in the ED.  Did consider PE but unlikely given the patient did deny chest pain and shortness of breath, not hypoxic and no calf tenderness on exam.  Laceration repair was well-tolerated.  Gave Tdap booster.  Advised to follow-up with his PCP.  Discussed appropriate wound care at home.  Discussed return pertinent return precautions.  Discharged home.        Final Clinical Impression(s) / ED Diagnoses Final diagnoses:  Laceration of scalp, initial encounter  Injury of head, initial encounter    Rx / DC Orders ED Discharge Orders     None          Gareth Eagle, PA-C 05/20/23 1515    Melene Plan, DO 05/20/23 1517

## 2023-05-20 NOTE — Discharge Instructions (Addendum)
Sutured repair Keep the laceration site dry for the next 24 hours and leave the dressing in place. After 24 hours you may remove the dressing and gently clean the laceration site with antibacterial soap and warm water. Do not scrub the area. Do not soak the area and water for long periods of time. Don't use hydrogen peroxide, iodine-based solutions, or alcohol, which can slow healing, and will probably be painful! Apply topical bacitracin 1-2 times per day for the next 3-5 days. Return to the emergency department in 6 - 8 days for removal of the sutures.  You should return sooner for any signs of infection which would include increased redness around the wound, increased swelling, new drainage of yellow pus.

## 2023-05-20 NOTE — Progress Notes (Signed)
VASCULAR LAB    Left lower extremity venous duplex has been performed.  See CV proc for preliminary results.  Messaged negative results to Riki Sheer, PA-C via secure chat  Sherren Kerns, RVT 05/20/2023, 12:19 PM

## 2023-05-20 NOTE — ED Triage Notes (Addendum)
Pt presents today due to a fall that happened around 8 am. According to his caregiver, pt was agitated and and fell from his bed and hit hiss head on a wooden head board with no LOC, caregiver made interventions by applying cold compression to stop bleeding and applied antibiotic cream on the cut before bringing him to the ED.   Upon assessment, pt presents with laceration to the back of his head. Measured  6cm by 1cm. Medication list does not show any blood thinners.

## 2023-05-20 NOTE — ED Notes (Signed)
TDap was given at 1518 in left deltoid. Lot #: [id number] and expire date 07/06/2025

## 2023-05-29 ENCOUNTER — Emergency Department (HOSPITAL_COMMUNITY)
Admission: EM | Admit: 2023-05-29 | Discharge: 2023-05-29 | Disposition: A | Discharge status: Home / Self Care | Payer: Medicare Other | Attending: Emergency Medicine | Admitting: Emergency Medicine

## 2023-05-29 ENCOUNTER — Encounter (HOSPITAL_COMMUNITY): Payer: Self-pay

## 2023-05-29 ENCOUNTER — Other Ambulatory Visit: Payer: Self-pay

## 2023-05-29 DIAGNOSIS — Z4802 Encounter for removal of sutures: Secondary | ICD-10-CM | POA: Insufficient documentation

## 2023-05-29 DIAGNOSIS — F840 Autistic disorder: Secondary | ICD-10-CM | POA: Diagnosis not present

## 2023-05-29 NOTE — ED Provider Notes (Signed)
[place] EMERGENCY DEPARTMENT AT [facility] Provider Note   CSN: [id number] Arrival date & time: 05/29/23  1134     History  No chief complaint on file.   Ralph Black is a 47 y.o. male with PMHx autism and intellectual disability presenting to ED for staple removal. Patient was seen in ED on 05/20/2023 after a fall and had 9 staples placed. Patient and caregivers stating that patient has been doing well since fall and is without complaint today. Denies LOC, seizures, nausea, vomiting.  HPI     Home Medications Prior to Admission medications   Medication Sig Start Date End Date Taking? Authorizing Provider  clonazePAM (KLONOPIN) 0.5 MG tablet Take 0.5 mg by mouth as needed for anxiety.    [provider]  cloNIDine (CATAPRES) 0.1 MG tablet Take 0.1 mg by mouth 2 (two) times daily.    [provider]  desmopressin (DDAVP) 0.2 MG tablet Take 0.2 mg by mouth at bedtime.     [provider]  divalproex (DEPAKOTE ER) 250 MG 24 hr tablet Take 250 mg by mouth 2 (two) times daily.     [provider]  fesoterodine (TOVIAZ) 4 MG TB24 tablet Take 4 mg by mouth at bedtime.    [provider]  linaclotide (LINZESS) 145 MCG CAPS capsule Take 145 mcg by mouth daily before breakfast.    [provider]  metoprolol tartrate (LOPRESSOR) 25 MG tablet Take 25 mg by mouth daily.    [provider]  OLANZapine (ZYPREXA) 15 MG tablet Take 1 tablet (15 mg total) by mouth at bedtime. 03/06/17   Maxie Barb, MD  QUEtiapine (SEROQUEL) 100 MG tablet Take 200 mg by mouth at bedtime.    [provider]  traZODone (DESYREL) 50 MG tablet Take 50 mg by mouth at bedtime.    [provider]  zolpidem (AMBIEN) 10 MG tablet Take 10 mg by mouth at bedtime.     [provider]      Allergies    Cephalosporins and Penicillins    Review of Systems   Review of Systems  Skin:        Staple removal     Physical Exam Updated Vital Signs BP (!) 125/96   Pulse (!) 109   Temp 98.2 F (36.8 C)   Resp 16   SpO2 98%  Physical Exam Vitals and nursing note reviewed.  Constitutional:      General: He is not in acute distress.    Appearance: He is not ill-appearing or toxic-appearing.  HENT:     Head: Normocephalic and atraumatic.  Eyes:     General: No scleral icterus.       Right eye: No discharge.        Left eye: No discharge.     Conjunctiva/sclera: Conjunctivae normal.  Cardiovascular:     Rate and Rhythm: Normal rate.  Pulmonary:     Effort: Pulmonary effort is normal.  Abdominal:     General: Abdomen is flat.  Skin:    General: Skin is warm and dry.     Comments: Laceration appears appropriately healed with 9 staple intact. No bleeding, spreading erythema, purulence, swelling.   Neurological:     General: No focal deficit present.     Mental Status: He is alert. Mental status is at baseline.  Psychiatric:        Mood and Affect: Mood normal.        Behavior: Behavior normal.  ED Results / Procedures / Treatments   Labs (all labs ordered are listed, but only abnormal results are displayed) Labs Reviewed - No data to display  EKG None  Radiology No results found.  Procedures Procedures    Medications Ordered in ED Medications - No data to display  ED Course/ Medical Decision Making/ A&P                                 Medical Decision Making  This patient presents to the ED for concern of Staple removal   Co morbidities that complicate the patient evaluation  autsim   Problem List / ED Course / Critical interventions / Medication management  Patient presents to ED for staple removal. Per chart review, patient had 9 staples on 05/20/2023. Laceration appears to have healed well. Staples removed without complication. Patient and caregivers denying any other concerns today. Overall patient appears well today. I have reviewed the patients home  medicines and have made adjustments as needed Provided patient with return precautions. Discharged in good condition.   Social Determinants of Health:  autism          Final Clinical Impression(s) / ED Diagnoses Final diagnoses:  Encounter for staple removal    Rx / DC Orders ED Discharge Orders     None         Dorthy Cooler, [place] 05/29/23 1428    Edwin Dada P, DO 05/30/23 [phone]

## 2023-05-29 NOTE — ED Notes (Signed)
Patient verbalizes understanding of discharge instructions. Opportunity for questioning and answers were provided. Pt discharged from ED. 

## 2023-05-29 NOTE — ED Triage Notes (Signed)
Pt here for removal of stitches to back of head; no fevers, caregiver endorses site healing as expected

## 2024-05-14 ENCOUNTER — Encounter (HOSPITAL_BASED_OUTPATIENT_CLINIC_OR_DEPARTMENT_OTHER): Attending: Internal Medicine | Admitting: Internal Medicine

## 2024-05-14 DIAGNOSIS — L89320 Pressure ulcer of left buttock, unstageable: Secondary | ICD-10-CM | POA: Insufficient documentation

## 2024-05-14 DIAGNOSIS — F840 Autistic disorder: Secondary | ICD-10-CM | POA: Insufficient documentation

## 2024-05-28 ENCOUNTER — Encounter (HOSPITAL_BASED_OUTPATIENT_CLINIC_OR_DEPARTMENT_OTHER): Attending: Internal Medicine | Admitting: Internal Medicine

## 2024-05-28 DIAGNOSIS — F840 Autistic disorder: Secondary | ICD-10-CM | POA: Insufficient documentation

## 2024-05-28 DIAGNOSIS — L89320 Pressure ulcer of left buttock, unstageable: Secondary | ICD-10-CM | POA: Insufficient documentation

## 2024-06-04 ENCOUNTER — Encounter (HOSPITAL_BASED_OUTPATIENT_CLINIC_OR_DEPARTMENT_OTHER): Admitting: Internal Medicine

## 2024-06-04 DIAGNOSIS — L89320 Pressure ulcer of left buttock, unstageable: Secondary | ICD-10-CM | POA: Diagnosis not present

## 2024-06-12 ENCOUNTER — Encounter (HOSPITAL_BASED_OUTPATIENT_CLINIC_OR_DEPARTMENT_OTHER): Admitting: Internal Medicine

## 2024-06-12 DIAGNOSIS — L89320 Pressure ulcer of left buttock, unstageable: Secondary | ICD-10-CM

## 2024-06-20 ENCOUNTER — Encounter (HOSPITAL_BASED_OUTPATIENT_CLINIC_OR_DEPARTMENT_OTHER): Admitting: Internal Medicine

## 2024-06-20 DIAGNOSIS — L89320 Pressure ulcer of left buttock, unstageable: Secondary | ICD-10-CM | POA: Diagnosis not present

## 2024-06-25 ENCOUNTER — Encounter (HOSPITAL_BASED_OUTPATIENT_CLINIC_OR_DEPARTMENT_OTHER): Attending: Internal Medicine | Admitting: Internal Medicine

## 2024-06-25 DIAGNOSIS — L89320 Pressure ulcer of left buttock, unstageable: Secondary | ICD-10-CM | POA: Diagnosis not present

## 2024-06-25 DIAGNOSIS — F840 Autistic disorder: Secondary | ICD-10-CM | POA: Diagnosis not present

## 2024-07-04 ENCOUNTER — Encounter (HOSPITAL_BASED_OUTPATIENT_CLINIC_OR_DEPARTMENT_OTHER): Admitting: Internal Medicine

## 2024-07-04 DIAGNOSIS — L89320 Pressure ulcer of left buttock, unstageable: Secondary | ICD-10-CM | POA: Diagnosis not present

## 2024-07-04 DIAGNOSIS — F840 Autistic disorder: Secondary | ICD-10-CM | POA: Diagnosis not present

## 2024-07-11 ENCOUNTER — Encounter (HOSPITAL_BASED_OUTPATIENT_CLINIC_OR_DEPARTMENT_OTHER): Admitting: Internal Medicine

## 2024-07-11 DIAGNOSIS — L89320 Pressure ulcer of left buttock, unstageable: Secondary | ICD-10-CM | POA: Diagnosis not present

## 2024-07-18 ENCOUNTER — Encounter (HOSPITAL_BASED_OUTPATIENT_CLINIC_OR_DEPARTMENT_OTHER): Admitting: Internal Medicine

## 2024-07-18 DIAGNOSIS — L89320 Pressure ulcer of left buttock, unstageable: Secondary | ICD-10-CM

## 2024-08-01 ENCOUNTER — Encounter (HOSPITAL_BASED_OUTPATIENT_CLINIC_OR_DEPARTMENT_OTHER): Attending: Internal Medicine | Admitting: Internal Medicine

## 2024-08-01 DIAGNOSIS — L89320 Pressure ulcer of left buttock, unstageable: Secondary | ICD-10-CM

## 2024-08-01 DIAGNOSIS — F840 Autistic disorder: Secondary | ICD-10-CM

## 2024-08-15 ENCOUNTER — Encounter (HOSPITAL_BASED_OUTPATIENT_CLINIC_OR_DEPARTMENT_OTHER): Admitting: Internal Medicine

## 2024-08-15 DIAGNOSIS — F840 Autistic disorder: Secondary | ICD-10-CM

## 2024-08-15 DIAGNOSIS — L89320 Pressure ulcer of left buttock, unstageable: Secondary | ICD-10-CM | POA: Diagnosis not present

## 2024-08-29 ENCOUNTER — Encounter (HOSPITAL_BASED_OUTPATIENT_CLINIC_OR_DEPARTMENT_OTHER): Admitting: Internal Medicine

## 2024-09-03 ENCOUNTER — Encounter (HOSPITAL_BASED_OUTPATIENT_CLINIC_OR_DEPARTMENT_OTHER): Attending: Internal Medicine | Admitting: Internal Medicine

## 2024-09-03 DIAGNOSIS — L89320 Pressure ulcer of left buttock, unstageable: Secondary | ICD-10-CM | POA: Diagnosis present

## 2024-09-03 DIAGNOSIS — F840 Autistic disorder: Secondary | ICD-10-CM | POA: Diagnosis not present

## 2024-09-24 ENCOUNTER — Encounter (HOSPITAL_BASED_OUTPATIENT_CLINIC_OR_DEPARTMENT_OTHER): Admitting: Internal Medicine

## 2024-09-24 DIAGNOSIS — L89320 Pressure ulcer of left buttock, unstageable: Secondary | ICD-10-CM | POA: Diagnosis not present

## 2024-09-24 DIAGNOSIS — F840 Autistic disorder: Secondary | ICD-10-CM
